# Patient Record
Sex: Female | Born: 1965 | Race: White | Hispanic: No | Marital: Married | Smoking: Never smoker
Health system: Southern US, Community
[De-identification: ages and names within clinical notes are randomized; demographics above are authoritative.]

## PROBLEM LIST (undated history)

## (undated) DIAGNOSIS — R011 Cardiac murmur, unspecified: Secondary | ICD-10-CM

## (undated) DIAGNOSIS — M069 Rheumatoid arthritis, unspecified: Secondary | ICD-10-CM

## (undated) DIAGNOSIS — I10 Essential (primary) hypertension: Secondary | ICD-10-CM

## (undated) DIAGNOSIS — F419 Anxiety disorder, unspecified: Secondary | ICD-10-CM

## (undated) HISTORY — DX: Anxiety disorder, unspecified: F41.9

## (undated) HISTORY — DX: Essential (primary) hypertension: I10

## (undated) HISTORY — DX: Rheumatoid arthritis, unspecified: M06.9

---

## 1998-04-14 HISTORY — PX: ABDOMINAL HYSTERECTOMY: SHX81

## 2001-04-14 HISTORY — PX: TONSILLECTOMY: SUR1361

## 2002-04-14 HISTORY — PX: NASAL SEPTUM SURGERY: SHX37

## 2004-04-13 ENCOUNTER — Emergency Department: Payer: Self-pay | Admitting: General Practice

## 2004-10-24 ENCOUNTER — Ambulatory Visit: Payer: Self-pay | Admitting: Family Medicine

## 2005-02-06 ENCOUNTER — Ambulatory Visit: Payer: Self-pay | Admitting: Unknown Physician Specialty

## 2005-03-04 ENCOUNTER — Ambulatory Visit: Payer: Self-pay | Admitting: Family Medicine

## 2005-05-28 ENCOUNTER — Emergency Department: Payer: Self-pay | Admitting: Emergency Medicine

## 2005-07-28 ENCOUNTER — Ambulatory Visit: Payer: Self-pay | Admitting: Unknown Physician Specialty

## 2006-05-14 ENCOUNTER — Ambulatory Visit: Payer: Self-pay | Admitting: Gastroenterology

## 2007-04-29 ENCOUNTER — Ambulatory Visit: Payer: Self-pay | Admitting: Unknown Physician Specialty

## 2009-08-27 ENCOUNTER — Ambulatory Visit: Payer: Self-pay | Admitting: Gastroenterology

## 2011-11-05 ENCOUNTER — Emergency Department: Payer: Self-pay | Admitting: Emergency Medicine

## 2011-11-13 ENCOUNTER — Ambulatory Visit: Payer: Self-pay | Admitting: Unknown Physician Specialty

## 2012-04-14 HISTORY — PX: BREAST BIOPSY: SHX20

## 2012-10-18 ENCOUNTER — Other Ambulatory Visit: Payer: Self-pay | Admitting: Bariatrics

## 2012-10-18 ENCOUNTER — Ambulatory Visit: Payer: Self-pay | Admitting: Bariatrics

## 2012-10-18 DIAGNOSIS — IMO0001 Reserved for inherently not codable concepts without codable children: Secondary | ICD-10-CM

## 2012-10-18 DIAGNOSIS — K219 Gastro-esophageal reflux disease without esophagitis: Secondary | ICD-10-CM

## 2012-10-18 LAB — CBC WITH DIFFERENTIAL/PLATELET
Basophil #: 0 x10 3/mm 3
Basophil %: 0.7 %
Eosinophil #: 0.3 x10 3/mm 3
Eosinophil %: 4.4 %
HCT: 37.7 %
HGB: 12.8 g/dL
Lymphocyte %: 20.9 %
Lymphs Abs: 1.5 x10 3/mm 3
MCH: 30.4 pg
MCHC: 34 g/dL
MCV: 89 fL
Monocyte #: 0.5 "x10 3/mm "
Monocyte %: 6.6 %
Neutrophil #: 4.8 x10 3/mm 3
Neutrophil %: 67.4 %
Platelet: 310 x10 3/mm 3
RBC: 4.22 X10 6/mm 3
RDW: 14.2 %
WBC: 7.1 x10 3/mm 3

## 2012-10-18 LAB — PHOSPHORUS: Phosphorus: 3 mg/dL

## 2012-10-18 LAB — MAGNESIUM: Magnesium: 1.9 mg/dL

## 2012-10-18 LAB — AMYLASE: Amylase: 54 U/L (ref 25–115)

## 2012-10-18 LAB — COMPREHENSIVE METABOLIC PANEL
Albumin: 3.4 g/dL (ref 3.4–5.0)
Alkaline Phosphatase: 122 U/L (ref 50–136)
Anion Gap: 7 (ref 7–16)
BUN: 16 mg/dL (ref 7–18)
Bilirubin,Total: 0.4 mg/dL (ref 0.2–1.0)
Calcium, Total: 9 mg/dL (ref 8.5–10.1)
Chloride: 101 mmol/L (ref 98–107)
Co2: 33 mmol/L — ABNORMAL HIGH (ref 21–32)
Creatinine: 1.06 mg/dL (ref 0.60–1.30)
EGFR (African American): >60
EGFR (Non-African Amer.): >60
Glucose: 122 mg/dL — ABNORMAL HIGH (ref 65–99)
Osmolality: 284 (ref 275–301)
Potassium: 2.9 mmol/L — ABNORMAL LOW (ref 3.5–5.1)
SGOT(AST): 25 U/L (ref 15–37)
SGPT (ALT): 37 U/L (ref 12–78)
Sodium: 141 mmol/L (ref 136–145)
Total Protein: 7.8 g/dL (ref 6.4–8.2)

## 2012-10-18 LAB — TSH: Thyroid Stimulating Horm: 2.54 u[IU]/mL

## 2012-10-18 LAB — IRON AND TIBC
Iron Bind.Cap.(Total): 307 ug/dL (ref 250–450)
Iron Saturation: 16 %
Iron: 49 ug/dL — ABNORMAL LOW (ref 50–170)
Unbound Iron-Bind.Cap.: 258 ug/dL

## 2012-10-18 LAB — PROTIME-INR
INR: 1
Prothrombin Time: 13.4 s

## 2012-10-18 LAB — FOLATE: Folic Acid: 10.4 ng/mL

## 2012-10-18 LAB — BILIRUBIN, DIRECT: Bilirubin, Direct: 0.1 mg/dL (ref 0.00–0.20)

## 2012-10-18 LAB — APTT: Activated PTT: 33.9 secs (ref 23.6–35.9)

## 2012-10-18 LAB — FERRITIN: Ferritin (ARMC): 101 ng/mL

## 2012-10-18 LAB — LIPASE, BLOOD: Lipase: 325 U/L (ref 73–393)

## 2012-10-18 LAB — HEMOGLOBIN A1C: Hemoglobin A1C: 6.3 %

## 2012-10-21 ENCOUNTER — Ambulatory Visit
Admission: RE | Admit: 2012-10-21 | Discharge: 2012-10-21 | Disposition: A | Discharge status: Home / Self Care | Payer: Managed Care, Other (non HMO) | Source: Ambulatory Visit | Attending: Bariatrics | Admitting: Bariatrics

## 2012-10-21 DIAGNOSIS — K219 Gastro-esophageal reflux disease without esophagitis: Secondary | ICD-10-CM

## 2012-10-21 DIAGNOSIS — IMO0001 Reserved for inherently not codable concepts without codable children: Secondary | ICD-10-CM

## 2012-11-01 ENCOUNTER — Ambulatory Visit: Payer: Self-pay | Admitting: Internal Medicine

## 2012-11-02 ENCOUNTER — Ambulatory Visit: Payer: Self-pay | Admitting: Internal Medicine

## 2012-11-10 ENCOUNTER — Encounter: Payer: Self-pay | Admitting: General Surgery

## 2012-11-10 ENCOUNTER — Ambulatory Visit (INDEPENDENT_AMBULATORY_CARE_PROVIDER_SITE_OTHER): Payer: Managed Care, Other (non HMO) | Admitting: General Surgery

## 2012-11-10 VITALS — BP 118/80 | HR 70 | Resp 12 | Ht 66.0 in | Wt 310.0 lb

## 2012-11-10 DIAGNOSIS — R928 Other abnormal and inconclusive findings on diagnostic imaging of breast: Secondary | ICD-10-CM | POA: Insufficient documentation

## 2012-11-10 NOTE — Patient Instructions (Addendum)
Continue self breast exams. Call office for any new breast issues or concerns.  The breast biopsy procedure was reviewed with the patient. The potential for bleeding, infection, and pain was reviewed. At this time, the benefits outweigh the risk and the patient is amenable to proceed.  Breast Biopsy, Stereotactic A stereotactic breast biopsy takes a tissue sample from the breast with a special instrument. This is done when:  The problem (lump, abnormality, mass) can be seen on X-ray, but not felt on physical exam.  Suspicious, small calcium deposits (calcifications) are seen in the breast.  There is a change in shape or appearance of the breast, thickening, or asymmetry on mammogram (breast X-ray).  You have nipple changes (unusual or bloody discharge, crusting, retraction, dimpling).  Your caregiver is making a surgical diagnosis. The biopsy may be done on a special table, with your face down and your breasts placed through openings in the table. Computerized imaging (special form of X-rays) is used. The images are not obtained using regular X-ray film. So, exposure to radiation is reduced. Images are seen through several different angles. The surgeon removes small pieces of the suspicious tissue through a hollow needle. The tissue will be sent to the lab for analysis. The surgeon can look at the pictures right away, rather than wait for an X-ray to be developed. Your caregiver can mark the lesions (abnormal tissue formations) electronically. Then the computer can tell exactly where the problem is, or if it has moved. BENEFITS OF THE PROCEDURE  This is a good way to see if tiny lumps, abnormal looking tissue, or calcium deposits that you cannot feel are cancerous or require further treatment or follow-up.  Needle biopsy is a simple procedure. It may be performed in an outpatient imaging center. This means you have the procedure and go home the same day, without checking into a hospital.  It  is less painful than open surgery. The results are as accurate as when a tissue sample is removed surgically.  The procedure is faster, less expensive, less invasive, does not distort the breast, and leaves little or no scar.  Breast defects, which can make future mammograms hard to read and interpret, do not remain.  Recovery time is brief. Patients can soon resume their normal activities.  Using VAD (vacuum assisted device) may make it possible to remove entire lesions.  A breast biopsy can indicate if you need surgery, other treatment, or combined treatment. LET YOUR CAREGIVER KNOW ABOUT:  Allergies.  Medications taken, including herbs, eye drops, over-the-counter medications, and creams.  Use of steroids (by mouth or creams).  Previous problems with anesthetics or numbing medication.  If you are taking blood thinner medications or aspirin.  Possibility of pregnancy, if this applies.  History of blood clots (thrombophlebitis).  History of bleeding or blood problems.  Previous surgery.  Other health problems. RISKS AND COMPLICATIONS  Infection (germ growing in the wound). This can often be treated with antibiotics.  Bleeding, following surgery. Your surgeon takes every precaution to keep this from happening.  There is some concern that if a cancerous mass is present, cancer cells might be spread by the needle. Whether this actually happens is not known. It does not appear to be a significant risk.  X-ray guided breast biopsy is not infallible (not always correct). The problem may be missed or the extent of the problem may be underestimated. This would mean the biopsy did not manage to remove a piece of the diseased tissue or  enough of the diseased tissue.  Lesions present, with calcium deposits scattered throughout the breast, are difficult to target by stereotactic method. Those lesions near the chest wall also are hard to learn about by this method. If the mammogram  shows only a vague change in tissue density, but no definite mass or nodule, the X-ray guided method may not be successful. Occasionally, even after a successful biopsy, the tissue diagnosis remains uncertain. A surgical biopsy will be needed, if abnormal or precancerous cells are found on core biopsy.  Altering or deforming of the breast.  Unable to find, or missing the lesion.  Rarely, the needle may go through the chest wall into the lung area. TWO BIOPSY INSTRUMENTS MAY BE USED IN THE PROCEDURE The conventional biopsy device (core needle biopsy device) consists of an inner needle with a trough extending from it at one end, and an overlying sheath. It is attached to a spring-loaded mechanism that propels it forward. The trough fills with tissue. The outer sheath instantly moves forward to cut the tissue and keep it in the trough. Each sample is obtained in a fraction of a second. It is necessary to withdraw the needle after each sample is taken to collect the tissue.  A newer type of instrument, the VAD (vacuum assisted device), uses vacuum pressure to pull breast tissue into a needle and remove it. The needle does not need to be withdrawn after each sampling. Another advantage is that biopsies are obtained in an orderly manner, by rotating the device. This helps make sure that the entire area of interest will be sampled. When using the automated core biopsy needle, sampling is more random.  FOR COMFORT DURING THE TEST  Relax as much as possible.  Try to follow instructions, to speed up the test.  Let your caregiver know if you are uncomfortable, anxious, or in pain. PROCEDURE  You are awake during the procedure, and you go home the same day (outpatient). A specially trained radiologist will do this procedure. First, the skin is cleansed. Then, it is injected with a local anesthetic. A small nick is made in the skin, and the tip of the biopsy needle is put into the calculated site of the lesion.  A special mammography machine uses ionizing radiation to help guide the radiologist's instrument to the site of the abnormal growth. At this point, stereo images are again obtained, to confirm that the needle tip is at the problem area. Usually 5 to 10 samples are collected when doing a core biopsy. At least 12 are collected when using the vacuum assisted device (VAD). Then, a final set of images is obtained. If they show that the lesion has been mostly or completely removed, a small clip is left at the biopsy site. This is so that it can be easily located, in case the lesion turns out to be cancer. Afterward, the skin opening is stitched (sutured) or taped closed, and covered with a dressing. Your caregiver may apply a pressure dressing and an ice pack, to prevent bleeding and swelling in the breast.  X-ray guided breast biopsy can take 30 minutes to 1 hour, or more. The X-rays usually have no side effects, and no radiation remains in your body. There is usually little or no pain. Usually no scar is left from the tiny skin incision. Many women find that the major discomfort of the procedure is from lying on their stomach, or staying in 1 position for the length of the procedure. This  discomfort may be reduced by carefully placed cushions. You should wear a good support bra to the procedure. You will be asked to remove jewelry, dentures, eye glasses, metal objects, or clothing that might interfere with the X-ray images. You may want to have someone with you, to take you home after the procedure. AFTER THE PROCEDURE   After surgery, if you are doing well and have no problems, you will be allowed to go home.  You may resume your regular diet, or as directed by your caregiver. HOME CARE INSTRUCTIONS   Follow your caregiver's recommendations for medications, care of the biopsy site, follow-up appointments, and further treatment.  Only take over-the-counter or prescription medicines for pain, discomfort, or  fever as directed by your caregiver.  An ice pack applied to the affected area may help with discomfort and keep the swelling down.  Change dressings as directed.  Wear a good support bra for as long as your caregiver recommends.  Avoid strenuous activity for at least 24 hours, or as advised by your caregiver. Finding out the results of your test Not all test results are available during your visit. If your test results are not back during the visit, make an appointment with your caregiver to find out the results. Do not assume everything is normal if you have not heard from your caregiver or the medical facility. It is important for you to follow up on all of your test results.  SEEK MEDICAL CARE IF:   You develop a rash.  You have problems with your medicines.  You become lightheaded or dizzy. SEEK IMMEDIATE MEDICAL CARE IF:   There is increased bleeding (more than a small spot) from the biopsy site.  You notice redness, swelling, or increasing pain in the wound.  Pus is coming from the wound.  You have a fever.  You notice a bad smell coming from the wound or dressing.  You develop shortness of breath.  You develop chest pain.  You pass out. Document Released: 12/28/2002 Document Revised: 06/23/2011 Document Reviewed: 02/02/2009 [facility] Patient Information 2014 [place], [place].  Patient has been scheduled for a left breast stereotactic biopsy at [facility] for 11-29-12 at 4 pm. She will check-in at the [facility] at 3:30 pm. This patient is aware of date, time, and instructions. Patient verbalizes understanding.

## 2012-11-10 NOTE — Progress Notes (Signed)
Patient ID: Danielle Vance, female   DOB: September 17, 1965, 47 y.o.   MRN: [id number]  Chief Complaint  Patient presents with  . Follow-up    mammogram    HPI Danielle Vance is a 47 y.o. female who presents for a breast evaluation. The most recent mammogram was done on 11-02-12 and ultrasound as well. States she "can't feel anything".  Denies any recent breast trauma or injury. This was the patient's first mammogram in 5 years. Patient does perform regular self breast checks and her previous mammogram was 2009. No family history of breast cancer. She is currently discussing gastric bypass with Dr Alva Garnet for later in the year.  HPI  Past Medical History  Diagnosis Date  . Rheumatoid arthritis   . Hypertension   . Anxiety     Past Surgical History  Procedure Laterality Date  . Nasal septum surgery  2004  . Tonsillectomy  2003  . Abdominal hysterectomy  2000    No family history on file.  Social History History  Substance Use Topics  . Smoking status: Never Smoker   . Smokeless tobacco: Never Used  . Alcohol Use: No    No Known Allergies  Current Outpatient Prescriptions  Medication Sig Dispense Refill  . atenolol-chlorthalidone (TENORETIC) 50-25 MG per tablet Take 1 tablet by mouth daily.      . citalopram (CELEXA) 20 MG tablet Take 1 tablet by mouth daily.      . folic acid (FOLVITE) 1 MG tablet Take 1 tablet by mouth daily.      . hydroxychloroquine (PLAQUENIL) 200 MG tablet Take 1 tablet by mouth 2 (two) times daily.      Marland Kitchen KLOR-CON 10 10 MEQ tablet Take 1 tablet by mouth daily.      . methotrexate (RHEUMATREX) 2.5 MG tablet Take 1 tablet by mouth daily.       No current facility-administered medications for this visit.    Review of Systems Review of Systems  Constitutional: Negative.   Respiratory: Negative.   Cardiovascular: Negative.     Blood pressure 118/80, pulse 70, resp. rate 12, height 5\' 6"  (1.676 m), weight 310 lb (140.615 kg).  Physical Exam Physical  Exam  Constitutional: She is oriented to person, place, and time. She appears well-developed and well-nourished.  Cardiovascular: Normal rate and regular rhythm.   Pulmonary/Chest: Effort normal and breath sounds normal. Right breast exhibits no inverted nipple, no mass, no nipple discharge, no skin change and no tenderness. Left breast exhibits no inverted nipple, no mass, no nipple discharge, no skin change and no tenderness.  Lymphadenopathy:    She has no cervical adenopathy.    She has no axillary adenopathy.  Neurological: She is alert and oriented to person, place, and time.  Skin: Skin is warm and dry.    Data Reviewed Screening mammograms dated November 01, 2012 suggested an ill-defined density in the inferior medial aspect of the left breast. Stable medial retroareolar nodularity noted. BI-RAD-0.  Left breast ultrasound dated November 02, 2012 showed no ultrasound abnormality in the area of mammographic concern. A benign intramammary lymph node was noted in the upper inner quadrant of the breast. BI-RAD-4. Focal spot compression views of the same date showed persistent density in the lower outer quadrant of the breast.  Review of the 2009 films suggest a vague density in the area present concern, not as prominent as noted earlier this month.  Focused ultrasound examination of the left breast from the 6 to 10:00 position was completed.  No clear-cut area of architectural abnormality, cystic or solid lesion identified. No charge. No images.  Assessment    Possible abnormality of the lower inner quadrant left breast.     Plan    Options for management were reviewed: 1) early stereotactic biopsy versus 2) 6 months observation. The patient reports she is planning on gastric bypass in October of this year, and I would anticipate a 30-40 pound weight loss between now and the neck mammogram. This could certainly change the architecture of the breast and make comparison very difficult. For this  reason I recommended early biopsy. The stereotactic procedure was reviewed in detail.     Patient has been scheduled for a left breast stereotactic biopsy at [facility] for 11-29-12 at 4 pm. She will check-in at the [facility] at 3:30 pm. This patient is aware of date, time, and instructions. Patient verbalizes understanding.   Earline Mayotte 11/10/2012, 8:50 PM

## 2012-11-15 ENCOUNTER — Ambulatory Visit: Payer: Self-pay | Admitting: Bariatrics

## 2012-11-29 ENCOUNTER — Ambulatory Visit: Payer: Self-pay | Admitting: General Surgery

## 2012-11-29 DIAGNOSIS — N63 Unspecified lump in unspecified breast: Secondary | ICD-10-CM

## 2012-12-02 ENCOUNTER — Encounter: Payer: Self-pay | Admitting: General Surgery

## 2012-12-02 ENCOUNTER — Telehealth: Payer: Self-pay | Admitting: *Deleted

## 2012-12-02 LAB — PATHOLOGY REPORT

## 2012-12-02 NOTE — Telephone Encounter (Signed)
Message     Please notify the patient the pathology was fine. Arrange for a follow up unilateral mammogram in six months w/ OV to follow. Thanks.   Patient notified as instructed and verbalizes understanding. She has been placed in the recalls for 6 months.

## 2012-12-06 ENCOUNTER — Ambulatory Visit (INDEPENDENT_AMBULATORY_CARE_PROVIDER_SITE_OTHER): Payer: Managed Care, Other (non HMO) | Admitting: *Deleted

## 2012-12-06 DIAGNOSIS — R928 Other abnormal and inconclusive findings on diagnostic imaging of breast: Secondary | ICD-10-CM

## 2012-12-06 NOTE — Patient Instructions (Signed)
Patient here today for follow up post left breast biopsy.  Dressing removed, steristrip in place and aware it may come off in one week.  Minimal bruising noted.  The patient is aware that a heating pad may be used for comfort as needed.  Aware of pathology. Follow up as scheduled. 

## 2012-12-13 ENCOUNTER — Ambulatory Visit: Payer: Self-pay | Admitting: Bariatrics

## 2013-01-14 ENCOUNTER — Ambulatory Visit: Payer: Self-pay | Admitting: Bariatrics

## 2013-01-14 LAB — POTASSIUM: Potassium: 3.3 mmol/L — ABNORMAL LOW (ref 3.5–5.1)

## 2013-01-14 LAB — MAGNESIUM: Magnesium: 1.9 mg/dL

## 2013-02-17 ENCOUNTER — Other Ambulatory Visit: Payer: Self-pay

## 2013-03-03 ENCOUNTER — Other Ambulatory Visit: Payer: Self-pay | Admitting: Bariatrics

## 2013-03-03 LAB — POTASSIUM: Potassium: 3.7 mmol/L (ref 3.5–5.1)

## 2013-03-14 HISTORY — PX: ROUX-EN-Y GASTRIC BYPASS: SHX1104

## 2013-03-21 DIAGNOSIS — I10 Essential (primary) hypertension: Secondary | ICD-10-CM | POA: Insufficient documentation

## 2013-03-21 DIAGNOSIS — G4733 Obstructive sleep apnea (adult) (pediatric): Secondary | ICD-10-CM | POA: Insufficient documentation

## 2013-04-04 ENCOUNTER — Ambulatory Visit: Payer: Self-pay | Admitting: Bariatrics

## 2013-04-14 ENCOUNTER — Ambulatory Visit: Payer: Self-pay | Admitting: Bariatrics

## 2013-06-02 ENCOUNTER — Ambulatory Visit: Payer: Self-pay | Admitting: General Surgery

## 2013-06-02 ENCOUNTER — Encounter: Payer: Self-pay | Admitting: General Surgery

## 2013-06-13 ENCOUNTER — Encounter: Payer: Self-pay | Admitting: General Surgery

## 2013-06-13 ENCOUNTER — Ambulatory Visit (INDEPENDENT_AMBULATORY_CARE_PROVIDER_SITE_OTHER): Payer: Managed Care, Other (non HMO) | Admitting: General Surgery

## 2013-06-13 ENCOUNTER — Ambulatory Visit: Payer: Managed Care, Other (non HMO) | Admitting: General Surgery

## 2013-06-13 VITALS — BP 140/84 | HR 76 | Resp 12 | Ht 66.0 in | Wt 257.0 lb

## 2013-06-13 DIAGNOSIS — N63 Unspecified lump in unspecified breast: Secondary | ICD-10-CM

## 2013-06-13 DIAGNOSIS — R928 Other abnormal and inconclusive findings on diagnostic imaging of breast: Secondary | ICD-10-CM

## 2013-06-13 NOTE — Progress Notes (Signed)
Patient ID: Danielle Vance, female   DOB: 01/16/1966, 48 y.o.   MRN: [id number]  Chief Complaint  Patient presents with  . Follow-up    6 month left diagnostic mammogram    HPI Danielle Vance is a 48 y.o. female who presents for a breast evaluation. The most recent mammogram was done on 06/02/13. Patient does perform regular self breast checks and gets regular mammograms done. The patient denies any new problems with the breasts at this time.    HPI  Past Medical History  Diagnosis Date  . Rheumatoid arthritis   . Hypertension   . Anxiety     Past Surgical History  Procedure Laterality Date  . Nasal septum surgery  2004  . Tonsillectomy  2003  . Abdominal hysterectomy  2000  . 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 No pertinent family history.  Social History History  Substance Use Topics  . Smoking status: Never Smoker   . Smokeless tobacco: Never Used  . Alcohol Use: No    No Known Allergies  Current Outpatient Prescriptions  Medication Sig Dispense Refill  . citalopram (CELEXA) 20 MG tablet Take 1 tablet by mouth daily.       No current facility-administered medications for this visit.    Review of Systems Review of Systems  Constitutional: Negative.   Respiratory: Negative.   Cardiovascular: Negative.     Blood pressure 140/84, pulse 76, resp. rate 12, height 5\' 6"  (1.676 m), weight 257 lb (116.574 kg).  Physical Exam Physical Exam  Constitutional: She is oriented to person, place, and time. She appears well-developed and well-nourished.  Neck: Neck supple. No thyromegaly present.  Cardiovascular: Normal rate, regular rhythm and normal heart sounds.   No murmur heard. Pulmonary/Chest: Effort normal and breath sounds normal. Right breast exhibits no inverted nipple, no mass, no nipple discharge, no skin change and no tenderness. Left breast exhibits no inverted nipple, no mass, no nipple discharge, no skin change and no tenderness.   Lymphadenopathy:    She has no cervical adenopathy.    She has no axillary adenopathy.  Neurological: She is alert and oriented to person, place, and time.  Skin: Skin is warm and dry.    Data Reviewed Biopsy of the left breast completed 11/30/2012 showed benign breast tissue with proliferative fibrocystic changes. Single foci of calcification. No atypia or malignancy. Focal cyst formation was also identified.  Left breast mammogram dated 06/02/2013 and was reviewed with the pre-biopsy mammograms. The area of asymmetric density is nearly completely resolved by my review. The radiologist as less convinced that recommended a followup bilateral diagnostic mammogram in 5 months.  Assessment    Benign breast exam. Negative biopsy. Near complete clearing of the density.    Plan    I would not have the patient get diagnostic mammograms, but to work with the radiology service these will be scheduled in 6 months. The patient does not need a followup office visit unless there is interval change which is unanticipated at this time. She will resume annual screening mammograms with her primary care provider in fall 2016.       2017 06/14/2013, 12:42 PM

## 2013-06-13 NOTE — Patient Instructions (Signed)
Patient to return as needed. Patient will have bilateral screening mammogram in 6 months. Patient to call with questions or concerns.

## 2013-06-20 ENCOUNTER — Other Ambulatory Visit: Payer: Self-pay | Admitting: General Practice

## 2013-06-20 LAB — COMPREHENSIVE METABOLIC PANEL
Albumin: 3.5 g/dL (ref 3.4–5.0)
Alkaline Phosphatase: 106 U/L
Anion Gap: 6 — ABNORMAL LOW (ref 7–16)
BUN: 11 mg/dL (ref 7–18)
Bilirubin,Total: 0.6 mg/dL (ref 0.2–1.0)
Calcium, Total: 9.6 mg/dL (ref 8.5–10.1)
Chloride: 104 mmol/L (ref 98–107)
Co2: 29 mmol/L (ref 21–32)
Creatinine: 0.72 mg/dL (ref 0.60–1.30)
EGFR (African American): >60
EGFR (Non-African Amer.): >60
Glucose: 85 mg/dL (ref 65–99)
Osmolality: 276 (ref 275–301)
Potassium: 3.4 mmol/L — ABNORMAL LOW (ref 3.5–5.1)
SGOT(AST): 25 U/L (ref 15–37)
SGPT (ALT): 36 U/L (ref 12–78)
Sodium: 139 mmol/L (ref 136–145)
Total Protein: 8.2 g/dL (ref 6.4–8.2)

## 2013-06-20 LAB — IRON AND TIBC
Iron Bind.Cap.(Total): 287 ug/dL (ref 250–450)
Iron Saturation: 16 %
Iron: 46 ug/dL — ABNORMAL LOW (ref 50–170)
Unbound Iron-Bind.Cap.: 241 ug/dL

## 2013-06-20 LAB — CBC WITH DIFFERENTIAL/PLATELET
Basophil #: 0 10*3/uL (ref 0.0–0.1)
Basophil %: 0.7 %
Eosinophil #: 0.2 10*3/uL (ref 0.0–0.7)
Eosinophil %: 3.6 %
HCT: 44.1 % (ref 35.0–47.0)
HGB: 14.3 g/dL (ref 12.0–16.0)
Lymphocyte #: 1.4 10*3/uL (ref 1.0–3.6)
Lymphocyte %: 20.1 %
MCH: 28.5 pg (ref 26.0–34.0)
MCHC: 32.4 g/dL (ref 32.0–36.0)
MCV: 88 fL (ref 80–100)
Monocyte #: 0.3 x10 3/mm (ref 0.2–0.9)
Monocyte %: 4.9 %
Neutrophil #: 4.9 10*3/uL (ref 1.4–6.5)
Neutrophil %: 70.7 %
Platelet: 262 10*3/uL (ref 150–440)
RBC: 5.02 10*6/uL (ref 3.80–5.20)
RDW: 14.5 % (ref 11.5–14.5)
WBC: 6.9 10*3/uL (ref 3.6–11.0)

## 2013-06-20 LAB — MAGNESIUM: Magnesium: 1.8 mg/dL

## 2013-06-20 LAB — FOLATE: Folic Acid: 27.6 ng/mL (ref 3.1–100.0)

## 2013-06-20 LAB — PHOSPHORUS: Phosphorus: 2.8 mg/dL (ref 2.5–4.9)

## 2013-06-20 LAB — FERRITIN: Ferritin (ARMC): 71 ng/mL (ref 8–388)

## 2013-06-20 LAB — AMYLASE: Amylase: 43 U/L (ref 25–115)

## 2013-08-12 DIAGNOSIS — I7300 Raynaud's syndrome without gangrene: Secondary | ICD-10-CM | POA: Insufficient documentation

## 2013-08-12 DIAGNOSIS — M069 Rheumatoid arthritis, unspecified: Secondary | ICD-10-CM | POA: Insufficient documentation

## 2013-08-31 DIAGNOSIS — M179 Osteoarthritis of knee, unspecified: Secondary | ICD-10-CM | POA: Insufficient documentation

## 2013-08-31 DIAGNOSIS — M1710 Unilateral primary osteoarthritis, unspecified knee: Secondary | ICD-10-CM | POA: Insufficient documentation

## 2013-12-21 ENCOUNTER — Ambulatory Visit: Payer: Self-pay | Admitting: Bariatrics

## 2013-12-21 LAB — COMPREHENSIVE METABOLIC PANEL
Albumin: 3.6 g/dL (ref 3.4–5.0)
Alkaline Phosphatase: 97 U/L
Anion Gap: 5 — ABNORMAL LOW (ref 7–16)
BUN: 11 mg/dL (ref 7–18)
Bilirubin,Total: 0.5 mg/dL (ref 0.2–1.0)
Calcium, Total: 9.5 mg/dL (ref 8.5–10.1)
Chloride: 106 mmol/L (ref 98–107)
Co2: 31 mmol/L (ref 21–32)
Creatinine: 0.67 mg/dL (ref 0.60–1.30)
EGFR (African American): >60
EGFR (Non-African Amer.): >60
Glucose: 90 mg/dL (ref 65–99)
Osmolality: 282 (ref 275–301)
Potassium: 3.6 mmol/L (ref 3.5–5.1)
SGOT(AST): 19 U/L (ref 15–37)
SGPT (ALT): 22 U/L
Sodium: 142 mmol/L (ref 136–145)
Total Protein: 7.8 g/dL (ref 6.4–8.2)

## 2013-12-21 LAB — CBC WITH DIFFERENTIAL/PLATELET
Basophil #: 0 10*3/uL (ref 0.0–0.1)
Basophil %: 0.7 %
Eosinophil #: 0.1 10*3/uL (ref 0.0–0.7)
Eosinophil %: 2.5 %
HCT: 44.5 % (ref 35.0–47.0)
HGB: 14.2 g/dL (ref 12.0–16.0)
Lymphocyte #: 1.3 10*3/uL (ref 1.0–3.6)
Lymphocyte %: 24.2 %
MCH: 29.7 pg (ref 26.0–34.0)
MCHC: 32 g/dL (ref 32.0–36.0)
MCV: 93 fL (ref 80–100)
Monocyte #: 0.3 x10 3/mm (ref 0.2–0.9)
Monocyte %: 5.8 %
Neutrophil #: 3.7 10*3/uL (ref 1.4–6.5)
Neutrophil %: 66.8 %
Platelet: 257 10*3/uL (ref 150–440)
RBC: 4.8 10*6/uL (ref 3.80–5.20)
RDW: 13.1 % (ref 11.5–14.5)
WBC: 5.5 10*3/uL (ref 3.6–11.0)

## 2013-12-21 LAB — MAGNESIUM: Magnesium: 1.7 mg/dL — ABNORMAL LOW

## 2013-12-21 LAB — IRON: Iron: 54 ug/dL (ref 50–170)

## 2013-12-21 LAB — FOLATE: Folic Acid: 26.8 ng/mL (ref 3.1–100.0)

## 2013-12-21 LAB — AMYLASE: Amylase: 42 U/L (ref 25–115)

## 2013-12-21 LAB — PHOSPHORUS: Phosphorus: 3.1 mg/dL (ref 2.5–4.9)

## 2013-12-21 LAB — FERRITIN: Ferritin (ARMC): 113 ng/mL (ref 8–388)

## 2014-01-18 ENCOUNTER — Ambulatory Visit: Payer: Self-pay | Admitting: Nurse Practitioner

## 2014-02-13 ENCOUNTER — Encounter: Payer: Self-pay | Admitting: General Surgery

## 2014-03-31 ENCOUNTER — Ambulatory Visit: Payer: Self-pay | Admitting: Unknown Physician Specialty

## 2014-04-10 DIAGNOSIS — M94262 Chondromalacia, left knee: Secondary | ICD-10-CM | POA: Insufficient documentation

## 2014-04-10 DIAGNOSIS — S83207D Unspecified tear of unspecified meniscus, current injury, left knee, subsequent encounter: Secondary | ICD-10-CM | POA: Insufficient documentation

## 2014-05-12 IMAGING — RF DG UGI W/ HIGH DENSITY W/KUB
19 of 24 series · 19 of 24 positions shown · non-contrast
Comparison: None.

CLINICAL DATA: Preop for gastric bypass surgery, symptoms of
gastroesophageal reflux

UPPER GI SERIES W/HIGH DENSITY W/KUB
TECHNIQUE: After obtaining a scout radiograph, upper GI series
performed with high density barium and effervescent agent. Thin
barium also used.
Fluoroscopy Time: 2 minutes 24 seconds

[Series 1: run · 1 of 1 slices shown (1 of 19)]
[im 1/1]
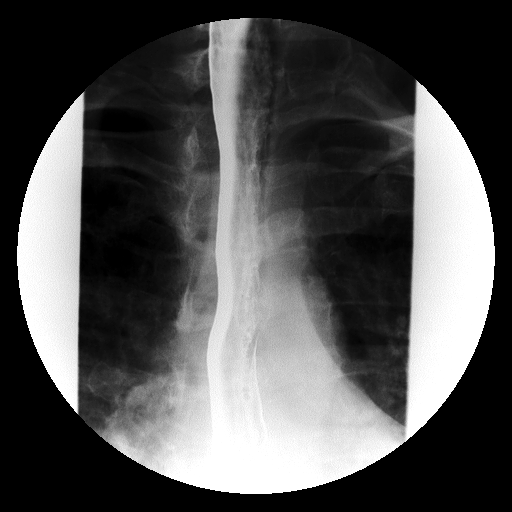

[Series 2: run · 1 of 1 slices shown (2 of 19)]
[im 1/1]
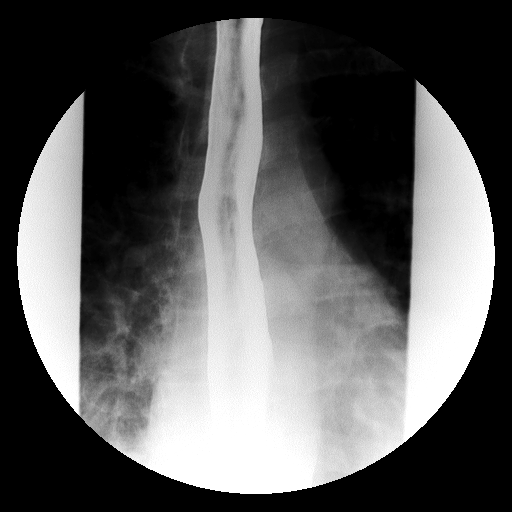

[Series 4: run · 1 of 1 slices shown (3 of 19)]
[im 1/1]
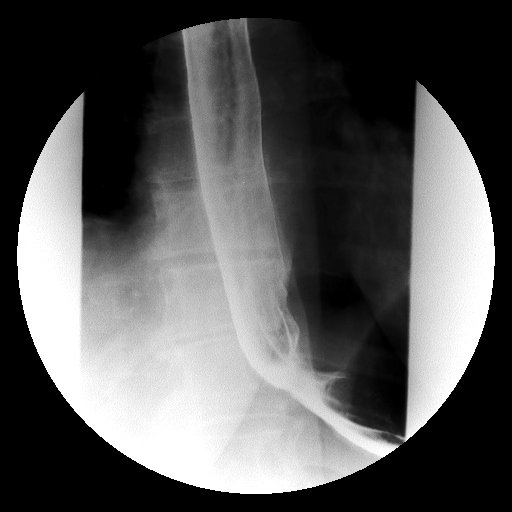

[Series 5: run · 1 of 1 slices shown (4 of 19)]
[im 1/1]
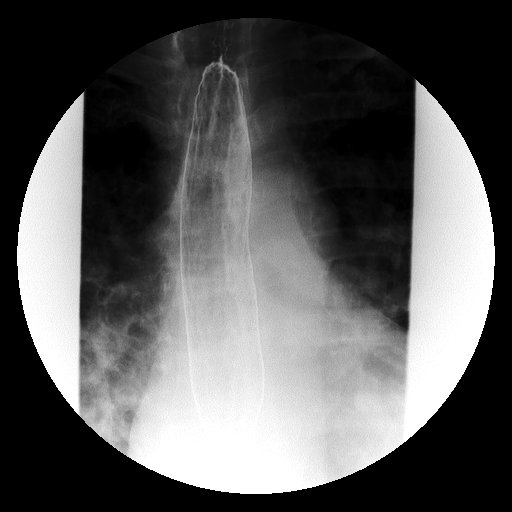

[Series 6: run · 1 of 1 slices shown (5 of 19)]
[im 1/1]
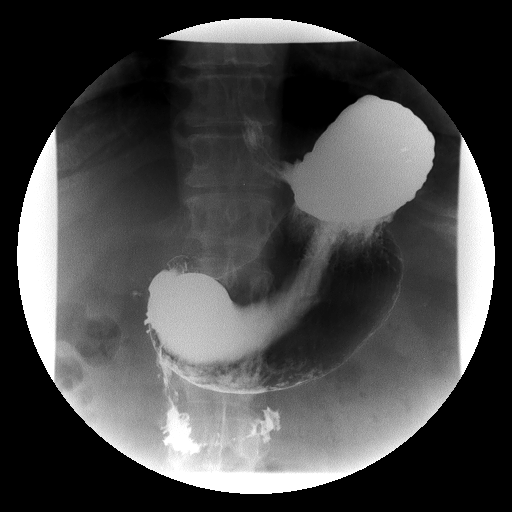

[Series 7: run · 1 of 1 slices shown (6 of 19)]
[im 1/1]
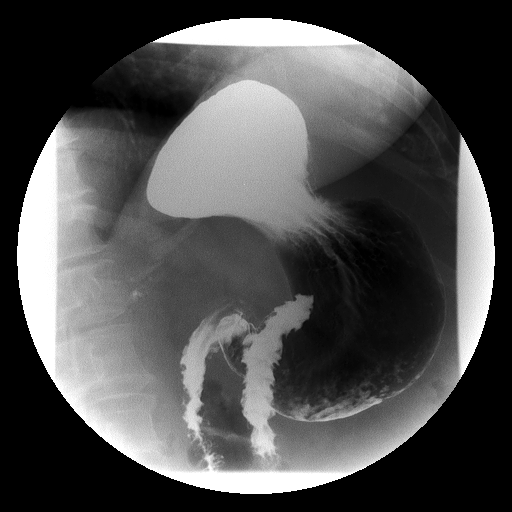

[Series 9: run · 1 of 1 slices shown (7 of 19)]
[im 1/1]
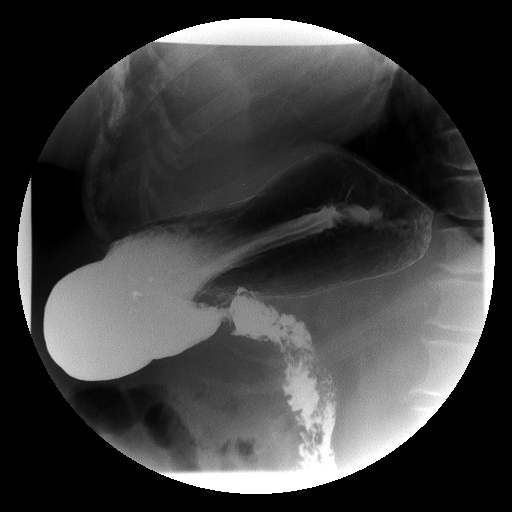

[Series 10: run · 1 of 1 slices shown (8 of 19)]
[im 1/1]
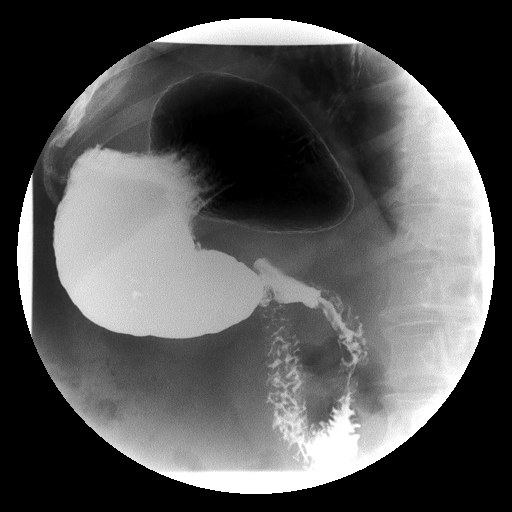

[Series 12: run · 1 of 1 slices shown (9 of 19)]
[im 1/1]
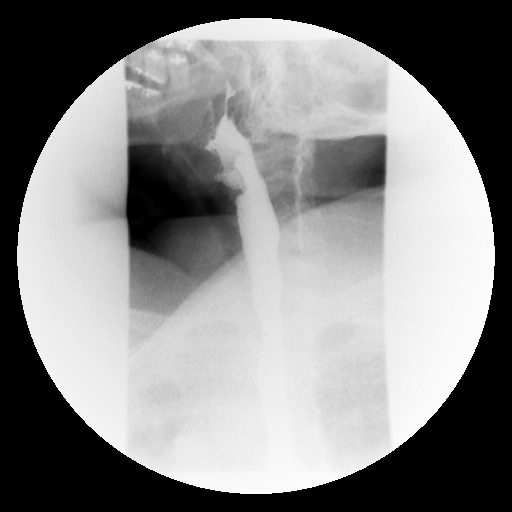

[Series 14: run · 1 of 1 slices shown (10 of 19)]
[im 1/1]
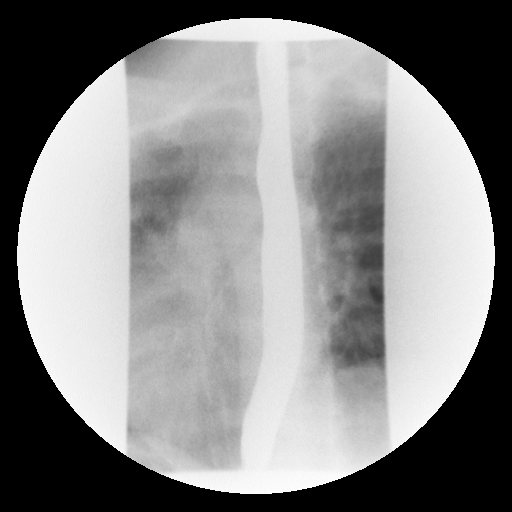

[Series 15: run · 1 of 1 slices shown (11 of 19)]
[im 1/1]
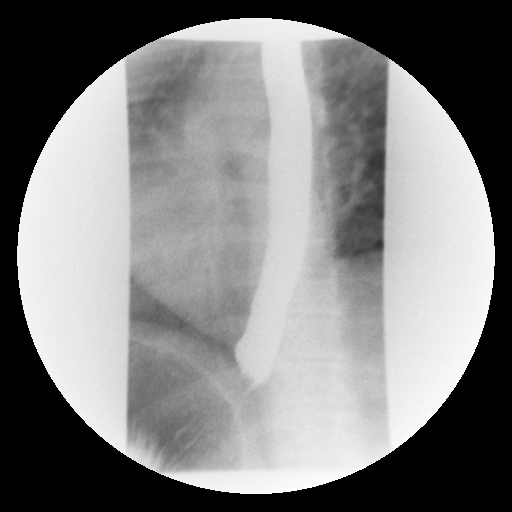

[Series 16: run · 1 of 1 slices shown (12 of 19)]
[im 1/1]
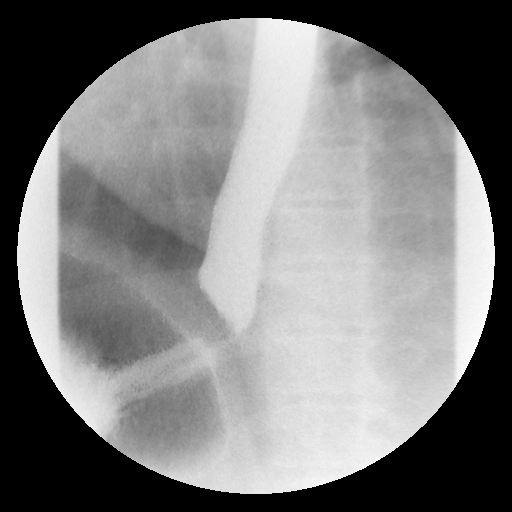

[Series 17: run · 1 of 1 slices shown (13 of 19)]
[im 1/1]
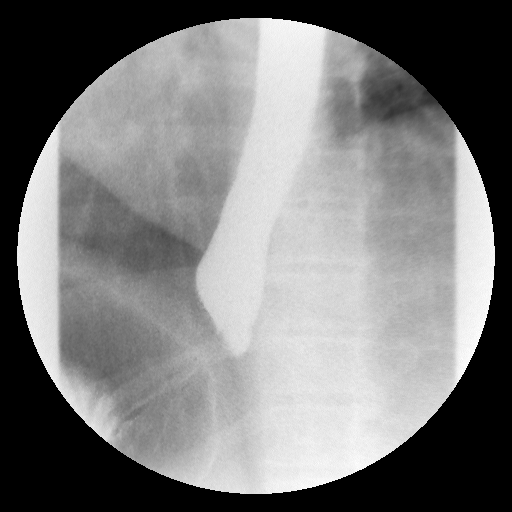

[Series 19: run · 1 of 1 slices shown (14 of 19)]
[im 1/1]
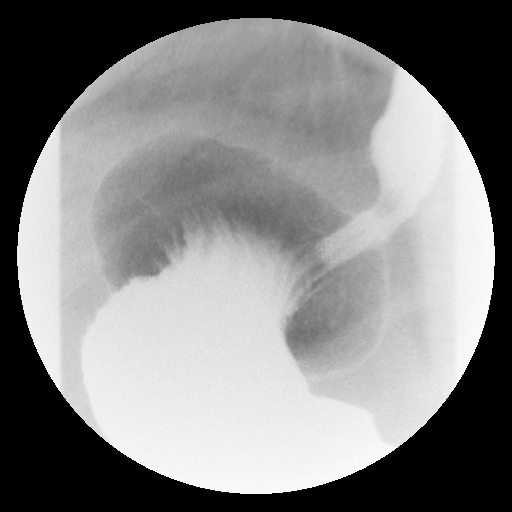

[Series 20: run · 1 of 1 slices shown (15 of 19)]
[im 1/1]
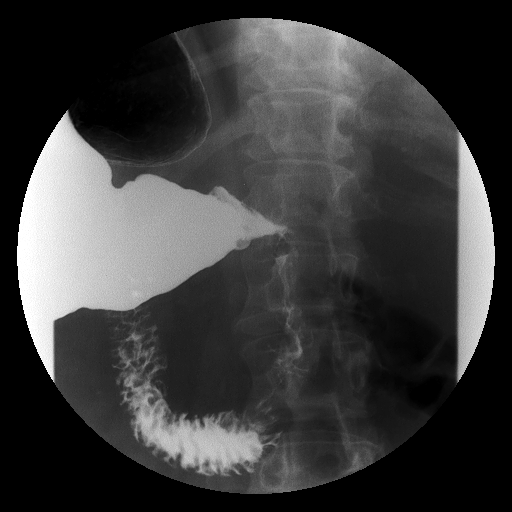

[Series 21: run · 1 of 1 slices shown (16 of 19)]
[im 1/1]
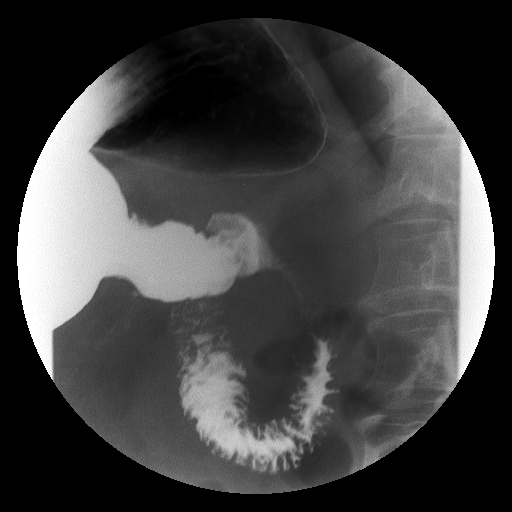

[Series 22: run · 1 of 1 slices shown (17 of 19)]
[im 1/1]
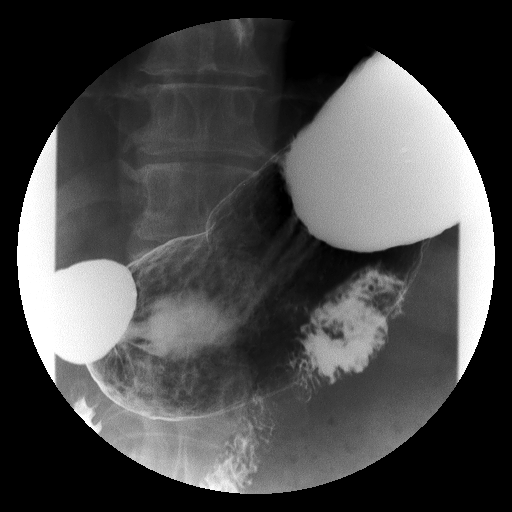

[Series 24: run · 1 of 1 slices shown (18 of 19)]
[im 1/1]
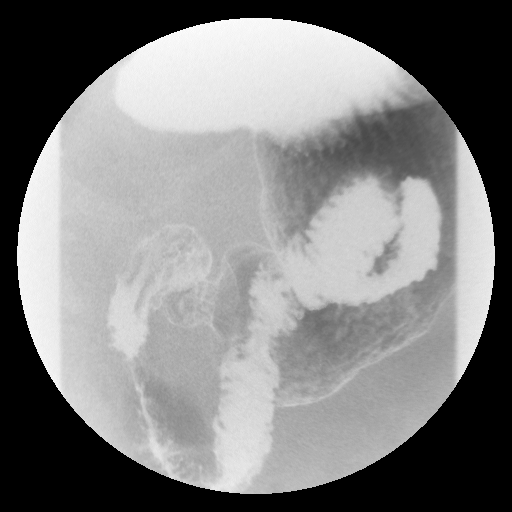

[Series 25: run · 1 of 1 slices shown (19 of 19)]
[im 1/1]
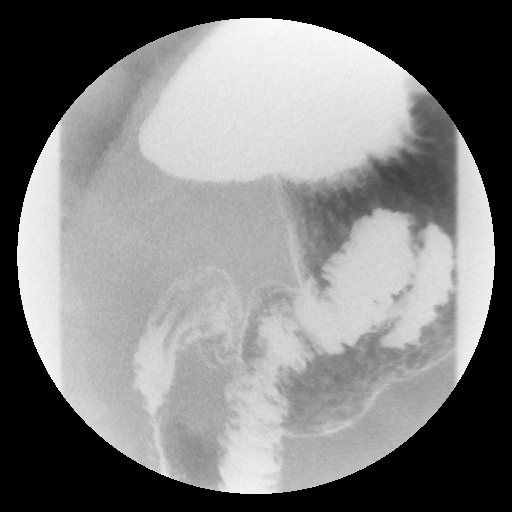

[19 of 24 positions shown; findings below may reference images not displayed]

FINDINGS: A preliminary film of the abdomen shows a nonspecific
bowel gas pattern.  No opaque calculi are noted.  The bones appear
normal.

A single contrast upper GI was performed.  The mucosa of the
esophagus is unremarkable.  A single contrast study shows the
swallowing mechanism to be normal.  Esophageal peristalsis is
normal.  No hiatal hernia is seen, and only faint gastroesophageal
reflux is demonstrated.

The stomach is normal in contour and peristalsis.  The duodenal
bulb fills and the duodenal loop is in normal position.
IMPRESSION: Negative single contrast upper GI other than very faint
gastroesophageal reflux.

## 2015-11-30 ENCOUNTER — Other Ambulatory Visit: Payer: Self-pay | Admitting: Nurse Practitioner

## 2015-11-30 DIAGNOSIS — N63 Unspecified lump in unspecified breast: Secondary | ICD-10-CM

## 2015-12-24 ENCOUNTER — Other Ambulatory Visit: Payer: Managed Care, Other (non HMO)

## 2015-12-24 ENCOUNTER — Ambulatory Visit: Payer: Managed Care, Other (non HMO) | Attending: Nurse Practitioner

## 2016-01-23 ENCOUNTER — Other Ambulatory Visit: Payer: Managed Care, Other (non HMO)

## 2016-01-23 ENCOUNTER — Ambulatory Visit: Payer: Managed Care, Other (non HMO) | Attending: Nurse Practitioner

## 2016-11-11 ENCOUNTER — Ambulatory Visit: Payer: Managed Care, Other (non HMO) | Admitting: Gastroenterology

## 2017-01-01 ENCOUNTER — Ambulatory Visit: Payer: Managed Care, Other (non HMO) | Admitting: Gastroenterology

## 2017-01-01 ENCOUNTER — Encounter: Payer: Self-pay | Admitting: Gastroenterology

## 2017-02-03 ENCOUNTER — Other Ambulatory Visit: Payer: Self-pay | Admitting: Nurse Practitioner

## 2017-02-03 DIAGNOSIS — Z1231 Encounter for screening mammogram for malignant neoplasm of breast: Secondary | ICD-10-CM

## 2017-02-04 ENCOUNTER — Other Ambulatory Visit: Payer: Self-pay | Admitting: Nurse Practitioner

## 2017-02-04 DIAGNOSIS — Z1231 Encounter for screening mammogram for malignant neoplasm of breast: Secondary | ICD-10-CM

## 2017-02-04 DIAGNOSIS — N63 Unspecified lump in unspecified breast: Secondary | ICD-10-CM

## 2017-03-12 ENCOUNTER — Ambulatory Visit
Admission: RE | Admit: 2017-03-12 | Discharge: 2017-03-12 | Disposition: A | Discharge status: Home / Self Care | Payer: Managed Care, Other (non HMO) | Source: Ambulatory Visit | Attending: Nurse Practitioner | Admitting: Nurse Practitioner

## 2017-03-12 DIAGNOSIS — R928 Other abnormal and inconclusive findings on diagnostic imaging of breast: Secondary | ICD-10-CM | POA: Diagnosis present

## 2017-03-12 DIAGNOSIS — Z1231 Encounter for screening mammogram for malignant neoplasm of breast: Secondary | ICD-10-CM

## 2017-03-12 DIAGNOSIS — N63 Unspecified lump in unspecified breast: Secondary | ICD-10-CM

## 2017-03-12 DIAGNOSIS — Z9889 Other specified postprocedural states: Secondary | ICD-10-CM | POA: Insufficient documentation

## 2017-03-12 DIAGNOSIS — N6320 Unspecified lump in the left breast, unspecified quadrant: Secondary | ICD-10-CM | POA: Diagnosis present

## 2017-12-28 ENCOUNTER — Other Ambulatory Visit: Payer: Self-pay | Admitting: Nurse Practitioner

## 2017-12-28 DIAGNOSIS — Z1231 Encounter for screening mammogram for malignant neoplasm of breast: Secondary | ICD-10-CM

## 2018-03-15 ENCOUNTER — Ambulatory Visit
Admission: RE | Admit: 2018-03-15 | Discharge: 2018-03-15 | Disposition: A | Discharge status: Home / Self Care | Payer: Managed Care, Other (non HMO) | Source: Ambulatory Visit | Attending: Nurse Practitioner | Admitting: Nurse Practitioner

## 2018-03-15 DIAGNOSIS — Z1231 Encounter for screening mammogram for malignant neoplasm of breast: Secondary | ICD-10-CM

## 2018-04-16 ENCOUNTER — Telehealth: Payer: Self-pay | Admitting: Neurology

## 2018-04-16 ENCOUNTER — Ambulatory Visit: Payer: Managed Care, Other (non HMO) | Admitting: Neurology

## 2018-04-16 NOTE — Telephone Encounter (Signed)
This patient did not show for a new patient appointment today. 

## 2018-04-19 ENCOUNTER — Encounter: Payer: Self-pay | Admitting: Neurology

## 2018-07-15 ENCOUNTER — Other Ambulatory Visit: Payer: Self-pay | Admitting: Acute Care

## 2018-07-15 DIAGNOSIS — G43009 Migraine without aura, not intractable, without status migrainosus: Secondary | ICD-10-CM

## 2018-08-18 ENCOUNTER — Ambulatory Visit: Admission: RE | Admit: 2018-08-18 | Payer: Self-pay | Source: Ambulatory Visit

## 2019-02-14 ENCOUNTER — Other Ambulatory Visit: Payer: Self-pay | Admitting: Nurse Practitioner

## 2019-03-02 ENCOUNTER — Other Ambulatory Visit: Payer: Self-pay | Admitting: Nurse Practitioner

## 2019-03-02 ENCOUNTER — Other Ambulatory Visit: Payer: Self-pay

## 2019-03-02 DIAGNOSIS — Z20822 Contact with and (suspected) exposure to covid-19: Secondary | ICD-10-CM

## 2019-03-02 DIAGNOSIS — Z1231 Encounter for screening mammogram for malignant neoplasm of breast: Secondary | ICD-10-CM

## 2019-03-04 LAB — NOVEL CORONAVIRUS, NAA: SARS-CoV-2, NAA: NOT DETECTED

## 2019-03-17 ENCOUNTER — Ambulatory Visit
Admission: RE | Admit: 2019-03-17 | Discharge: 2019-03-17 | Disposition: A | Discharge status: Home / Self Care | Payer: No Typology Code available for payment source | Source: Ambulatory Visit | Attending: Nurse Practitioner | Admitting: Nurse Practitioner

## 2019-03-17 DIAGNOSIS — Z1231 Encounter for screening mammogram for malignant neoplasm of breast: Secondary | ICD-10-CM | POA: Diagnosis present

## 2020-02-15 ENCOUNTER — Other Ambulatory Visit: Payer: Self-pay | Admitting: Nurse Practitioner

## 2020-02-15 DIAGNOSIS — Z1231 Encounter for screening mammogram for malignant neoplasm of breast: Secondary | ICD-10-CM

## 2020-02-16 ENCOUNTER — Other Ambulatory Visit (HOSPITAL_COMMUNITY): Payer: Self-pay | Admitting: Nurse Practitioner

## 2020-02-16 ENCOUNTER — Other Ambulatory Visit: Payer: Self-pay | Admitting: Nurse Practitioner

## 2020-02-16 DIAGNOSIS — N9412 Deep dyspareunia: Secondary | ICD-10-CM

## 2020-02-24 ENCOUNTER — Ambulatory Visit
Admission: RE | Admit: 2020-02-24 | Discharge: 2020-02-24 | Disposition: A | Discharge status: Home / Self Care | Payer: PRIVATE HEALTH INSURANCE | Source: Ambulatory Visit | Attending: Nurse Practitioner | Admitting: Nurse Practitioner

## 2020-02-24 ENCOUNTER — Other Ambulatory Visit: Payer: Self-pay

## 2020-02-24 DIAGNOSIS — N9412 Deep dyspareunia: Secondary | ICD-10-CM | POA: Diagnosis present

## 2020-05-12 ENCOUNTER — Other Ambulatory Visit: Payer: No Typology Code available for payment source

## 2021-08-04 ENCOUNTER — Emergency Department: Payer: BC Managed Care – PPO

## 2021-08-04 ENCOUNTER — Emergency Department
Admission: EM | Admit: 2021-08-04 | Discharge: 2021-08-04 | Disposition: A | Discharge status: Home / Self Care | Payer: BC Managed Care – PPO | Attending: Emergency Medicine | Admitting: Emergency Medicine

## 2021-08-04 ENCOUNTER — Other Ambulatory Visit: Payer: Self-pay

## 2021-08-04 DIAGNOSIS — R202 Paresthesia of skin: Secondary | ICD-10-CM | POA: Diagnosis present

## 2021-08-04 DIAGNOSIS — R200 Anesthesia of skin: Secondary | ICD-10-CM | POA: Diagnosis not present

## 2021-08-04 DIAGNOSIS — R0789 Other chest pain: Secondary | ICD-10-CM | POA: Insufficient documentation

## 2021-08-04 DIAGNOSIS — I10 Essential (primary) hypertension: Secondary | ICD-10-CM | POA: Diagnosis not present

## 2021-08-04 DIAGNOSIS — R519 Headache, unspecified: Secondary | ICD-10-CM | POA: Diagnosis not present

## 2021-08-04 LAB — BASIC METABOLIC PANEL
Anion gap: 6 (ref 5–15)
BUN: 21 mg/dL — ABNORMAL HIGH (ref 6–20)
CO2: 28 mmol/L (ref 22–32)
Calcium: 9.4 mg/dL (ref 8.9–10.3)
Chloride: 103 mmol/L (ref 98–111)
Creatinine, Ser: 0.76 mg/dL (ref 0.44–1.00)
GFR, Estimated: >60 mL/min (ref >60)
Glucose, Bld: 127 mg/dL — ABNORMAL HIGH (ref 70–99)
Potassium: 4.2 mmol/L (ref 3.5–5.1)
Sodium: 137 mmol/L (ref 135–145)

## 2021-08-04 LAB — CBC
HCT: 43 % (ref 36.0–46.0)
Hemoglobin: 14 g/dL (ref 12.0–15.0)
MCH: 30.3 pg (ref 26.0–34.0)
MCHC: 32.6 g/dL (ref 30.0–36.0)
MCV: 93.1 fL (ref 80.0–100.0)
Platelets: 294 10*3/uL (ref 150–400)
RBC: 4.62 MIL/uL (ref 3.87–5.11)
RDW: 13.8 % (ref 11.5–15.5)
WBC: 8 10*3/uL (ref 4.0–10.5)
nRBC: 0 % (ref 0.0–0.2)

## 2021-08-04 LAB — TROPONIN I (HIGH SENSITIVITY): Troponin I (High Sensitivity): 5 ng/L (ref <18)

## 2021-08-04 NOTE — ED Triage Notes (Signed)
Pt comes pov with left sided chest pressure and tingling down her left arm since yesterday. Pt states this has happened before and thinks it is related to her RA medication. States last time her BP was really high and they gave her IV hydralazine. BP today is 157/100.  ?

## 2021-08-04 NOTE — ED Provider Notes (Signed)
? ?[facility] ?Provider Note ? ? ? Event Date/Time  ? First MD Initiated Contact with Patient 08/04/21 1927   ?  (approximate) ? ? ?History  ? ?Chief Complaint ?Chest Pain ? ? ?HPI ? ?Danielle Vance is a 56 y.o. female with past medical history of hypertension and rheumatoid arthritis who presents to the ED complaining of chest pain and tingling.  Patient reports that her rheumatologist recently started her on Rinvoq 5 days ago and she is concerned that she is having a reaction to this.  She states that the day after starting the medicine she woke up with a headache, noticed her blood pressure was high that morning.  A couple of days later, she began to notice numbness and tingling along the left side of her face, left arm, and left chest.  She denies any associated weakness, facial droop, vision changes, or speech changes.  She has had constant numbness and tingling for the past 24 to 48 hours, decided to stop the Rinvoq 2 days ago.  She reports a similar reaction when taking this medication a couple of years ago.  She reports some mild discomfort in her left chest but denies any overt pain.  She has not had any fevers, cough, or difficulty breathing. ?  ? ? ?Physical Exam  ? ?Triage Vital Signs: ?ED Triage Vitals [08/04/21 1849]  ?Enc Vitals Group  ?   BP (!) 157/100  ?   Pulse Rate 74  ?   Resp 18  ?   Temp 98.1 ?F (36.7 ?C)  ?   Temp Source Oral  ?   SpO2 97 %  ?   Weight 215 lb (97.5 kg)  ?   Height   ?   Head Circumference   ?   Peak Flow   ?   Pain Score 3  ?   Pain Loc   ?   Pain Edu?   ?   Excl. in GC?   ? ? ?Most recent vital signs: ?Vitals:  ? 08/04/21 1849 08/04/21 2100  ?BP: (!) 157/100 (!) 170/93  ?Pulse: 74 63  ?Resp: 18 17  ?Temp: 98.1 ?F (36.7 ?C)   ?SpO2: 97% 96%  ? ? ?Constitutional: Alert and oriented. ?Eyes: Conjunctivae are normal. ?Head: Atraumatic. ?Nose: No congestion/rhinnorhea. ?Mouth/Throat: Mucous membranes are moist.  ?Cardiovascular: Normal rate, regular  rhythm. Grossly normal heart sounds.  2+ radial pulses bilaterally. ?Respiratory: Normal respiratory effort.  No retractions. Lungs CTAB. ?Gastrointestinal: Soft and nontender. No distention. ?Musculoskeletal: No lower extremity tenderness nor edema.  ?Neurologic:  Normal speech and language. No gross focal neurologic deficits are appreciated. ? ? ? ?ED Results / Procedures / Treatments  ? ?Labs ?(all labs ordered are listed, but only abnormal results are displayed) ?Labs Reviewed  ?BASIC METABOLIC PANEL - Abnormal; Notable for the following components:  ?    Result Value  ? Glucose, Bld 127 (*)   ? BUN 21 (*)   ? All other components within normal limits  ?CBC  ?TROPONIN I (HIGH SENSITIVITY)  ? ? ? ?EKG ? ?ED ECG REPORT ?Harriet Masson, the attending physician, personally viewed and interpreted this ECG. ? ? Date: 08/04/2021 ? EKG Time: 18:46 ? Rate: 72 ? Rhythm: normal sinus rhythm ? Axis: LAD ? Intervals:none ? ST&T Change: None ? ?RADIOLOGY ?Chest x-ray reviewed by me with no infiltrate, edema, or effusion. ? ?PROCEDURES: ? ?Critical Care performed: No ? ?Procedures ? ? ?MEDICATIONS ORDERED IN ED: ?Medications -  No data to display ? ? ?IMPRESSION / MDM / ASSESSMENT AND PLAN / ED COURSE  ?I reviewed the triage vital signs and the nursing notes. ?             ?               ? ?56 y.o. female with past medical history of hypertension and rheumatoid arthritis who presents to the ED complaining of 4 days of headache and elevated blood pressure along with 2 days of left facial, chest, and arm numbness and tingling after starting on Rinvoq. ? ?Differential diagnosis includes, but is not limited to, stroke, TIA, electrolyte abnormality, ACS, PE, pneumonia, medication side effect. ? ?Patient nontoxic-appearing and in no acute distress, vital signs are remarkable only for mildly elevated blood pressure.  Patient with no focal neurologic deficits on exam and with isolated sensory symptoms and no focal weakness, low  suspicion for stroke.  Given her headache, we will screen CT head.  She does report some discomfort going into the left side of her chest however symptoms are very atypical for ACS.  EKG shows no evidence of arrhythmia or ischemia and troponin is negative, no repeat needed given constant symptoms for at least 24 hours.  Remainder of labs are reassuring with BMP showing no AKI or electrolyte abnormality, CBC without anemia or leukocytosis. ? ?CT head is negative for acute process, no evidence of stroke or intracranial hemorrhage.  Very low suspicion for stroke at this time given tingling without objective numbness.  Symptoms likely related to recent medication change and she is appropriate for discharge home with PCP follow-up for recheck of her blood pressure.  She was counseled to return to the ED for new worsening symptoms, patient agrees with plan. ? ?  ? ? ?FINAL CLINICAL IMPRESSION(S) / ED DIAGNOSES  ? ?Final diagnoses:  ?Paresthesia  ?Hypertension, unspecified type  ? ? ? ?Rx / DC Orders  ? ?ED Discharge Orders   ? ? None  ? ?  ? ? ? ?Note:  This document was prepared using Dragon voice recognition software and may include unintentional dictation errors. ?  ?Chesley NoonJessup, Jahnavi Muratore, MD ?08/04/21 2132 ? ?

## 2022-10-20 ENCOUNTER — Other Ambulatory Visit: Payer: Self-pay | Admitting: Nurse Practitioner

## 2022-12-04 ENCOUNTER — Other Ambulatory Visit: Payer: Self-pay

## 2022-12-04 MED ORDER — LOSARTAN POTASSIUM 100 MG PO TABS: 100.0000 mg | ORAL_TABLET | Freq: Every day | ORAL | 1 refills | Status: DC

## 2022-12-05 ENCOUNTER — Other Ambulatory Visit: Payer: Self-pay

## 2022-12-08 ENCOUNTER — Other Ambulatory Visit: Payer: Self-pay

## 2022-12-08 ENCOUNTER — Other Ambulatory Visit: Payer: Self-pay | Admitting: Internal Medicine

## 2022-12-09 ENCOUNTER — Other Ambulatory Visit: Payer: Self-pay

## 2022-12-09 MED ORDER — LOSARTAN POTASSIUM 100 MG PO TABS: 100.0000 mg | ORAL_TABLET | Freq: Every day | ORAL | 1 refills | Status: DC

## 2023-01-16 ENCOUNTER — Other Ambulatory Visit: Payer: Self-pay | Admitting: Internal Medicine

## 2023-01-19 ENCOUNTER — Other Ambulatory Visit: Payer: Self-pay | Admitting: Internal Medicine

## 2023-01-19 MED ORDER — ESCITALOPRAM OXALATE 20 MG PO TABS: 20.0000 mg | ORAL_TABLET | Freq: Every day | ORAL | 0 refills | Status: DC

## 2023-01-22 ENCOUNTER — Ambulatory Visit (INDEPENDENT_AMBULATORY_CARE_PROVIDER_SITE_OTHER): Payer: BC Managed Care – PPO | Admitting: Cardiology

## 2023-01-22 ENCOUNTER — Encounter: Payer: Self-pay | Admitting: Cardiology

## 2023-01-22 VITALS — BP 130/79 | HR 78 | Ht 66.0 in | Wt 235.2 lb

## 2023-01-22 DIAGNOSIS — Z131 Encounter for screening for diabetes mellitus: Secondary | ICD-10-CM

## 2023-01-22 DIAGNOSIS — Z1329 Encounter for screening for other suspected endocrine disorder: Secondary | ICD-10-CM

## 2023-01-22 DIAGNOSIS — Z13220 Encounter for screening for lipoid disorders: Secondary | ICD-10-CM

## 2023-01-22 DIAGNOSIS — Z1211 Encounter for screening for malignant neoplasm of colon: Secondary | ICD-10-CM | POA: Diagnosis not present

## 2023-01-22 DIAGNOSIS — Z1231 Encounter for screening mammogram for malignant neoplasm of breast: Secondary | ICD-10-CM | POA: Diagnosis not present

## 2023-01-22 DIAGNOSIS — I10 Essential (primary) hypertension: Secondary | ICD-10-CM | POA: Diagnosis not present

## 2023-01-22 MED ORDER — ESCITALOPRAM OXALATE 20 MG PO TABS: 20.0000 mg | ORAL_TABLET | Freq: Every day | ORAL | 3 refills | Status: DC

## 2023-01-22 MED ORDER — PANTOPRAZOLE SODIUM 40 MG PO TBEC: 40.0000 mg | DELAYED_RELEASE_TABLET | Freq: Every day | ORAL | 3 refills | Status: DC

## 2023-01-22 NOTE — Progress Notes (Signed)
Established Patient Office Visit  Subjective:  Patient ID: Danielle Vance, female    DOB: Nov 09, 1965  Age: 57 y.o. MRN: [id number]  Chief Complaint  Patient presents with   Follow-up    Refills    Patient in office needing refills on her medications. Patient reports feeling well, no complaints today. Medications refilled.  Overdue for mammogram, order sent.  Overdue for colonoscopy. Patient has seen Dr. Servando Snare before, will refer back to him.  Will return for fasting lab work.  Pap smear negative 02/15/2020.    No other concerns at this time.   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   TONSILLECTOMY  2003    Social History   Socioeconomic History   Marital status: Married    Spouse name: Not on file   Number of children: Not on file   Years of education: Not on file   Highest education level: Not on file  Occupational History   Not on file  Tobacco Use   Smoking status: Never   Smokeless tobacco: Never  Substance and Sexual Activity   Alcohol use: No   Drug use: No   Sexual activity: Not on file  Other Topics Concern   Not on file  Social History Narrative   Not on file   Social Determinants of Health   Financial Resource Strain: Not on file  Food Insecurity: Not on file  Transportation Needs: Not on file  Physical Activity: Not on file  Stress: Not on file  Social Connections: Not on file  Intimate Partner Violence: Not on file    Family History  Problem Relation Age of Onset   Breast cancer Neg Hx     No Known Allergies  Review of Systems  Constitutional: Negative.   HENT: Negative.    Eyes: Negative.   Respiratory: Negative.  Negative for shortness of breath.    Cardiovascular: Negative.  Negative for chest pain.  Gastrointestinal: Negative.  Negative for abdominal pain, constipation and diarrhea.  Genitourinary: Negative.   Musculoskeletal:  Negative for joint pain and myalgias.  Skin: Negative.   Neurological: Negative.  Negative for dizziness and headaches.  Endo/Heme/Allergies: Negative.   All other systems reviewed and are negative.      Objective:   BP 130/79   Pulse 78   Ht 5\' 6"  (1.676 m)   Wt 235 lb 3.2 oz (106.7 kg)   SpO2 97%   BMI 37.96 kg/m   Vitals:   01/22/23 1401  BP: 130/79  Pulse: 78  Height: 5\' 6"  (1.676 m)  Weight: 235 lb 3.2 oz (106.7 kg)  SpO2: 97%  BMI (Calculated): 37.98    Physical Exam Vitals and nursing note reviewed.  Constitutional:      Appearance: Normal appearance. She is normal weight.  HENT:     Head: Normocephalic and atraumatic.     Nose: Nose normal.     Mouth/Throat:     Mouth: Mucous membranes are moist.  Eyes:     Extraocular Movements: Extraocular movements intact.     Conjunctiva/sclera: Conjunctivae normal.     Pupils: Pupils are equal, round, and reactive to light.  Cardiovascular:  Rate and Rhythm: Normal rate and regular rhythm.     Pulses: Normal pulses.     Heart sounds: Normal heart sounds.  Pulmonary:     Effort: Pulmonary effort is normal.     Breath sounds: Normal breath sounds.  Abdominal:     General: Abdomen is flat. Bowel sounds are normal.     Palpations: Abdomen is soft.  Musculoskeletal:        General: Normal range of motion.     Cervical back: Normal range of motion.  Skin:    General: Skin is warm and dry.  Neurological:     General: No focal deficit present.     Mental Status: She is alert and oriented to person, place, and time.  Psychiatric:        Mood and Affect: Mood normal.        Behavior: Behavior normal.        Thought Content: Thought content normal.        Judgment: Judgment normal.      No results found for any visits on  01/22/23.  No results found for this or any previous visit (from the past 2160 hour(s)).    Assessment & Plan:  Return for fasting lab work.  Mammogram order sent.  Colonoscopy referral sent.  Medications refilled.   Problem List Items Addressed This Visit       Cardiovascular and Mediastinum   HTN (hypertension) - Primary   Other Visit Diagnoses     Encounter for screening mammogram for malignant neoplasm of breast       Relevant Orders   MM 3D SCREENING MAMMOGRAM BILATERAL BREAST   Colon cancer screening       Relevant Orders   Ambulatory referral to Gastroenterology   Diabetes mellitus screening       Relevant Orders   Hemoglobin A1c   CMP14+EGFR   Thyroid disorder screening       Relevant Orders   TSH   Lipid screening       Relevant Orders   Lipid Profile       Return in about 4 months (around 05/25/2023).   Total time spent: 25 minutes  [employer], NP  01/22/2023   This document may have been prepared by Dragon Voice Recognition software and as such may include unintentional dictation errors.

## 2023-01-23 ENCOUNTER — Telehealth: Payer: Self-pay

## 2023-01-23 NOTE — Telephone Encounter (Signed)
The patient called back to schedule her colonoscopy. I informed her to that she will have to cancel the appointment with [facility].

## 2023-02-02 ENCOUNTER — Telehealth: Payer: Self-pay

## 2023-02-02 ENCOUNTER — Other Ambulatory Visit: Payer: Self-pay

## 2023-02-02 ENCOUNTER — Encounter: Payer: Self-pay | Admitting: *Deleted

## 2023-02-02 DIAGNOSIS — Z800 Family history of malignant neoplasm of digestive organs: Secondary | ICD-10-CM

## 2023-02-02 DIAGNOSIS — Z1211 Encounter for screening for malignant neoplasm of colon: Secondary | ICD-10-CM

## 2023-02-02 MED ORDER — NA SULFATE-K SULFATE-MG SULF 17.5-3.13-1.6 GM/177ML PO SOLN: 1.0000 | Freq: Once | ORAL | 0 refills | Status: AC

## 2023-02-02 NOTE — Telephone Encounter (Signed)
Gastroenterology Pre-Procedure Review  Request Date: 02/18/23 Requesting Physician: Dr. Allegra Lai  PATIENT REVIEW QUESTIONS: The patient responded to the following health history questions as indicated:    1. Are you having any GI issues? no 2. Do you have a personal history of Polyps? yes (last colonoscopy 5-6 years ago with [facility]) 3. Do you have a family history of Colon Cancer or Polyps? yes (mother and maternal aunt colon cancer) 4. Diabetes Mellitus? no 5. Joint replacements in the past 12 months?no 6. Major health problems in the past 3 months?no 7. Any artificial heart valves, MVP, or defibrillator?no    MEDICATIONS & ALLERGIES:    Patient reports the following regarding taking any anticoagulation/antiplatelet therapy:   Plavix, Coumadin, Eliquis, Xarelto, Lovenox, Pradaxa, Brilinta, or Effient? no Aspirin? no  Patient confirms/reports the following medications:  Current Outpatient Medications  Medication Sig Dispense Refill   amLODipine (NORVASC) 5 MG tablet TAKE 1 TABLET BY MOUTH DAILY 90 tablet 2   CIPRODEX OTIC suspension      escitalopram (LEXAPRO) 20 MG tablet Take 1 tablet (20 mg total) by mouth daily. 90 tablet 3   folic acid (FOLVITE) 1 MG tablet folic acid 1 mg tablet     HYDROcodone-acetaminophen (NORCO/VICODIN) 5-325 MG tablet hydrocodone 5 mg-acetaminophen 325 mg tablet     losartan (COZAAR) 100 MG tablet Take 1 tablet (100 mg total) by mouth daily. 90 tablet 1   methotrexate (RHEUMATREX) 2.5 MG tablet TAKE 8 TABLETS BY MOUTH (20MG ) EVERY 7 DAYS     pantoprazole (PROTONIX) 40 MG tablet Take 1 tablet (40 mg total) by mouth daily. 90 tablet 3   No current facility-administered medications for this visit.    Patient confirms/reports the following allergies:  No Known Allergies  No orders of the defined types were placed in this encounter.   AUTHORIZATION INFORMATION Primary Insurance: 1D#: Group #:  Secondary Insurance: 1D#: Group #:  SCHEDULE  INFORMATION: Date:  Time: Location:

## 2023-02-12 ENCOUNTER — Other Ambulatory Visit: Payer: BC Managed Care – PPO

## 2023-02-13 LAB — CMP14+EGFR
ALT: 17 [IU]/L (ref 0–32)
AST: 18 [IU]/L (ref 0–40)
Albumin: 4.2 g/dL (ref 3.8–4.9)
Alkaline Phosphatase: 106 [IU]/L (ref 44–121)
BUN/Creatinine Ratio: 24 — ABNORMAL HIGH (ref 9–23)
BUN: 16 mg/dL (ref 6–24)
Bilirubin Total: 0.4 mg/dL (ref 0.0–1.2)
CO2: 25 mmol/L (ref 20–29)
Calcium: 9.7 mg/dL (ref 8.7–10.2)
Chloride: 104 mmol/L (ref 96–106)
Creatinine, Ser: 0.68 mg/dL (ref 0.57–1.00)
Globulin, Total: 3.1 g/dL (ref 1.5–4.5)
Glucose: 98 mg/dL (ref 70–99)
Potassium: 5 mmol/L (ref 3.5–5.2)
Sodium: 143 mmol/L (ref 134–144)
Total Protein: 7.3 g/dL (ref 6.0–8.5)
eGFR: 103 mL/min/{1.73_m2} (ref >59)

## 2023-02-13 LAB — LIPID PANEL
Chol/HDL Ratio: 3 {ratio} (ref 0.0–4.4)
Cholesterol, Total: 212 mg/dL — ABNORMAL HIGH (ref 100–199)
HDL: 70 mg/dL (ref >39)
LDL Chol Calc (NIH): 124 mg/dL — ABNORMAL HIGH (ref 0–99)
Triglycerides: 103 mg/dL (ref 0–149)
VLDL Cholesterol Cal: 18 mg/dL (ref 5–40)

## 2023-02-13 LAB — HEMOGLOBIN A1C
Est. average glucose Bld gHb Est-mCnc: 111 mg/dL
Hgb A1c MFr Bld: 5.5 % (ref 4.8–5.6)

## 2023-02-13 LAB — TSH: TSH: 1.78 u[IU]/mL (ref 0.450–4.500)

## 2023-02-17 ENCOUNTER — Telehealth: Payer: Self-pay

## 2023-02-17 NOTE — Telephone Encounter (Signed)
Pt LM asking for call back.

## 2023-02-18 ENCOUNTER — Encounter: Payer: Self-pay | Admitting: Gastroenterology

## 2023-02-18 ENCOUNTER — Ambulatory Visit: Payer: BC Managed Care – PPO | Admitting: Anesthesiology

## 2023-02-18 ENCOUNTER — Encounter
Admission: RE | Disposition: A | Discharge status: Home / Self Care | Payer: Self-pay | Source: Home / Self Care | Attending: Gastroenterology

## 2023-02-18 ENCOUNTER — Ambulatory Visit
Admission: RE | Admit: 2023-02-18 | Discharge: 2023-02-18 | Disposition: A | Discharge status: Home / Self Care | Payer: BC Managed Care – PPO | Attending: Gastroenterology | Admitting: Gastroenterology

## 2023-02-18 DIAGNOSIS — Z1211 Encounter for screening for malignant neoplasm of colon: Secondary | ICD-10-CM | POA: Insufficient documentation

## 2023-02-18 DIAGNOSIS — Z6837 Body mass index (BMI) 37.0-37.9, adult: Secondary | ICD-10-CM | POA: Diagnosis not present

## 2023-02-18 DIAGNOSIS — F419 Anxiety disorder, unspecified: Secondary | ICD-10-CM | POA: Insufficient documentation

## 2023-02-18 DIAGNOSIS — M069 Rheumatoid arthritis, unspecified: Secondary | ICD-10-CM | POA: Diagnosis not present

## 2023-02-18 DIAGNOSIS — I10 Essential (primary) hypertension: Secondary | ICD-10-CM | POA: Diagnosis not present

## 2023-02-18 DIAGNOSIS — Z713 Dietary counseling and surveillance: Secondary | ICD-10-CM

## 2023-02-18 DIAGNOSIS — D128 Benign neoplasm of rectum: Secondary | ICD-10-CM | POA: Insufficient documentation

## 2023-02-18 DIAGNOSIS — Z800 Family history of malignant neoplasm of digestive organs: Secondary | ICD-10-CM | POA: Diagnosis not present

## 2023-02-18 DIAGNOSIS — E669 Obesity, unspecified: Secondary | ICD-10-CM | POA: Diagnosis not present

## 2023-02-18 DIAGNOSIS — Z9884 Bariatric surgery status: Secondary | ICD-10-CM | POA: Insufficient documentation

## 2023-02-18 DIAGNOSIS — K621 Rectal polyp: Secondary | ICD-10-CM

## 2023-02-18 HISTORY — PX: COLONOSCOPY WITH PROPOFOL: SHX5780

## 2023-02-18 HISTORY — DX: Cardiac murmur, unspecified: R01.1

## 2023-02-18 HISTORY — PX: POLYPECTOMY: SHX5525

## 2023-02-18 SURGERY — COLONOSCOPY WITH PROPOFOL
Anesthesia: General

## 2023-02-18 MED ORDER — STERILE WATER FOR IRRIGATION IR SOLN
Status: DC | PRN
  Administered 2023-02-18: 50 mL

## 2023-02-18 MED ORDER — PROPOFOL 10 MG/ML IV BOLUS
INTRAVENOUS | Status: DC | PRN
  Administered 2023-02-18: 60 mg via INTRAVENOUS

## 2023-02-18 MED ORDER — MIDAZOLAM HCL 2 MG/2ML IJ SOLN
INTRAMUSCULAR | Status: AC
  Filled 2023-02-18: qty 2

## 2023-02-18 MED ORDER — MIDAZOLAM HCL 2 MG/2ML IJ SOLN
INTRAMUSCULAR | Status: DC | PRN
  Administered 2023-02-18: 2 mg via INTRAVENOUS

## 2023-02-18 MED ORDER — LIDOCAINE HCL (CARDIAC) PF 100 MG/5ML IV SOSY
PREFILLED_SYRINGE | INTRAVENOUS | Status: DC | PRN
  Administered 2023-02-18: 100 mg via INTRAVENOUS

## 2023-02-18 MED ORDER — SODIUM CHLORIDE 0.9 % IV SOLN: INTRAVENOUS | Status: DC

## 2023-02-18 MED ORDER — GLYCOPYRROLATE 0.2 MG/ML IJ SOLN
INTRAMUSCULAR | Status: DC | PRN
  Administered 2023-02-18: .2 mg via INTRAVENOUS

## 2023-02-18 MED ORDER — PROPOFOL 500 MG/50ML IV EMUL
INTRAVENOUS | Status: DC | PRN
  Administered 2023-02-18: 165 ug/kg/min via INTRAVENOUS

## 2023-02-18 NOTE — Op Note (Signed)
[facility] Gastroenterology Patient Name: Danielle Vance Procedure Date: 02/18/2023 8:34 AM MRN: [id number] Account #: [id number] Date of Birth: 27-Feb-1966 Admit Type: Outpatient Age: 57 Room: [facility] ENDO ROOM 1 Gender: Female Note Status: Finalized Instrument Name: Prentice Docker [id number] Procedure:             Colonoscopy Indications:           Screening in patient at increased risk: Colorectal                         cancer in mother before age 102, Last colonoscopy: May                         2011 Providers:             Toney Reil MD, MD Referring MD:          Margaretann Loveless, MD (Referring MD) Medicines:             General Anesthesia Complications:         No immediate complications. Estimated blood loss: None. Procedure:             Pre-Anesthesia Assessment:                        - Prior to the procedure, a History and Physical was                         performed, and patient medications and allergies were                         reviewed. The patient is competent. The risks and                         benefits of the procedure and the sedation options and                         risks were discussed with the patient. All questions                         were answered and informed consent was obtained.                         Patient identification and proposed procedure were                         verified by the physician, the nurse, the                         anesthesiologist, the anesthetist and the technician                         in the pre-procedure area in the procedure room in the                         endoscopy suite. Mental Status Examination: alert and                         oriented. Airway Examination: normal oropharyngeal  airway and neck mobility. Respiratory Examination:                         clear to auscultation. CV Examination: normal.                         Prophylactic Antibiotics: The patient  does not require                         prophylactic antibiotics. Prior Anticoagulants: The                         patient has taken no anticoagulant or antiplatelet                         agents. ASA Grade Assessment: II - A patient with mild                         systemic disease. After reviewing the risks and                         benefits, the patient was deemed in satisfactory                         condition to undergo the procedure. The anesthesia                         plan was to use general anesthesia. Immediately prior                         to administration of medications, the patient was                         re-assessed for adequacy to receive sedatives. The                         heart rate, respiratory rate, oxygen saturations,                         blood pressure, adequacy of pulmonary ventilation, and                         response to care were monitored throughout the                         procedure. The physical status of the patient was                         re-assessed after the procedure.                        After obtaining informed consent, the colonoscope was                         passed under direct vision. Throughout the procedure,                         the patient's blood pressure, pulse, and oxygen  saturations were monitored continuously. The                         Colonoscope was introduced through the anus and                         advanced to the the terminal ileum, with                         identification of the appendiceal orifice and IC                         valve. The colonoscopy was performed without                         difficulty. The patient tolerated the procedure well.                         The quality of the bowel preparation was evaluated                         using the BBPS [facility] Bowel Preparation Scale) with                         scores of: Right Colon = 3, Transverse Colon  = 3 and                         Left Colon = 3 (entire mucosa seen well with no                         residual staining, small fragments of stool or opaque                         liquid). The total BBPS score equals 9. The terminal                         ileum, ileocecal valve, appendiceal orifice, and                         rectum were photographed. Findings:      The perianal and digital rectal examinations were normal. Pertinent       negatives include normal sphincter tone and no palpable rectal lesions.      The terminal ileum appeared normal.      A 6 mm polyp was found in the rectum. The polyp was sessile. The polyp       was removed with a cold snare. Resection and retrieval were complete.       Estimated blood loss: none.      The retroflexed view of the distal rectum and anal verge was normal and       showed no anal or rectal abnormalities. Impression:            - The examined portion of the ileum was normal.                        - One 6 mm polyp in the rectum, removed with a cold  snare. Resected and retrieved.                        - The distal rectum and anal verge are normal on                         retroflexion view. Recommendation:        - Discharge patient to home (with escort).                        - Resume previous diet today.                        - Continue present medications.                        - Await pathology results.                        - Repeat colonoscopy in 5 years for surveillance. Procedure Code(s):     --- Professional ---                        [phone], Colonoscopy, flexible; with removal of                         tumor(s), polyp(s), or other lesion(s) by snare                         technique Diagnosis Code(s):     --- Professional ---                        Z80.0, Family history of malignant neoplasm of                         digestive organs                        D12.8, Benign neoplasm of rectum CPT  copyright 2022 American Medical Association. All rights reserved. The codes documented in this report are preliminary and upon coder review may  be revised to meet current compliance requirements. Dr. Libby Maw Toney Reil MD, MD 02/18/2023 9:01:29 AM This report has been signed electronically. Number of Addenda: 0 Note Initiated On: 02/18/2023 8:34 AM Scope Withdrawal Time: 0 hours 12 minutes 42 seconds  Total Procedure Duration: 0 hours 17 minutes 54 seconds  Estimated Blood Loss:  Estimated blood loss: none.      [facility]

## 2023-02-18 NOTE — H&P (Signed)
Arlyss Repress, MD [address]  [address]  [place], [place] [address]  Main: [phone]  Fax: [phone] Pager: [phone]  Primary Care Physician:  Margaretann Loveless, MD Primary Gastroenterologist:  Dr. Arlyss Repress  Pre-Procedure History & Physical: HPI:  MEOSHA CASTANON is a 57 y.o. female is here for an colonoscopy.   Past Medical History:  Diagnosis Date   Anxiety    Heart murmur    Hypertension    Rheumatoid arthritis (HCC)     Past Surgical History:  Procedure Laterality Date   ABDOMINAL HYSTERECTOMY  2000   BREAST BIOPSY Left 2014   clip marker- BENIGN BREAST TISSUE    NASAL SEPTUM SURGERY  2004   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   TONSILLECTOMY  2003    Prior to Admission medications   Medication Sig Start Date End Date Taking? Authorizing Provider  amLODipine (NORVASC) 5 MG tablet TAKE 1 TABLET BY MOUTH DAILY 10/20/22  Yes Miki Kins, FNP  escitalopram (LEXAPRO) 20 MG tablet Take 1 tablet (20 mg total) by mouth daily. 01/22/23  Yes Scoggins, Amber, NP  losartan (COZAAR) 100 MG tablet Take 1 tablet (100 mg total) by mouth daily. 12/09/22  Yes Margaretann Loveless, MD  methotrexate (RHEUMATREX) 2.5 MG tablet TAKE 8 TABLETS BY MOUTH (20MG ) EVERY 7 DAYS 12/03/16  Yes [provider]  pantoprazole (PROTONIX) 40 MG tablet Take 1 tablet (40 mg total) by mouth daily. 01/22/23  Yes Scoggins, Hospital doctor, NP  CIPRODEX OTIC suspension  10/29/16   [provider]  folic acid (FOLVITE) 1 MG tablet folic acid 1 mg tablet    [provider]  HYDROcodone-acetaminophen (NORCO/VICODIN) 5-325 MG tablet hydrocodone 5 mg-acetaminophen 325 mg tablet    [provider]    Allergies as of 02/02/2023   (No Known Allergies)    Family History  Problem Relation Age of Onset   Breast cancer Neg Hx     Social History   Socioeconomic History   Marital status: Married    Spouse name: Not on file   Number of children: Not on file   Years of  education: Not on file   Highest education level: Not on file  Occupational History   Not on file  Tobacco Use   Smoking status: Never   Smokeless tobacco: Never  Vaping Use   Vaping status: Never Used  Substance and Sexual Activity   Alcohol use: No    Comment: occas   Drug use: No   Sexual activity: Not on file  Other Topics Concern   Not on file  Social History Narrative   Not on file   Social Determinants of Health   Financial Resource Strain: Not on file  Food Insecurity: Not on file  Transportation Needs: Not on file  Physical Activity: Not on file  Stress: Not on file  Social Connections: Not on file  Intimate Partner Violence: Not on file    Review of Systems: See HPI, otherwise negative ROS  Physical Exam: BP (!) 140/92   Pulse 72   Temp 97.6 F (36.4 C) (Temporal)   Resp 18   Ht 5\' 6"  (1.676 m)   Wt 104.8 kg   SpO2 96%   BMI 37.28 kg/m  General:   Alert,  pleasant and cooperative in NAD Head:  Normocephalic and atraumatic. Neck:  Supple; no masses or thyromegaly. Lungs:  Clear throughout to auscultation.    Heart:  Regular rate and rhythm. Abdomen:  Soft, nontender and nondistended. Normal bowel sounds, without guarding, and without rebound.   Neurologic:  Alert and  oriented x4;  grossly normal neurologically.  Impression/Plan: DENALI SHARMA is here for an colonoscopy to be performed for colon cancer screening  Risks, benefits, limitations, and alternatives regarding  colonoscopy have been reviewed with the patient.  Questions have been answered.  All parties agreeable.   Lannette Donath, MD  02/18/2023, 8:29 AM

## 2023-02-18 NOTE — Anesthesia Postprocedure Evaluation (Signed)
Anesthesia Post Note  Patient: Danielle Vance  Procedure(s) Performed: COLONOSCOPY WITH PROPOFOL POLYPECTOMY  Patient location during evaluation: PACU Anesthesia Type: General Level of consciousness: awake and alert, oriented and patient cooperative Pain management: pain level controlled Vital Signs Assessment: post-procedure vital signs reviewed and stable Respiratory status: spontaneous breathing, nonlabored ventilation and respiratory function stable Cardiovascular status: blood pressure returned to baseline and stable Postop Assessment: adequate PO intake Anesthetic complications: no   No notable events documented.   Last Vitals:  Vitals:   02/18/23 0912 02/18/23 0922  BP:  125/78  Pulse: 74   Resp: 14   Temp:    SpO2: 97%     Last Pain:  Vitals:   02/18/23 0912  TempSrc:   PainSc: 0-No pain                 Reed Breech

## 2023-02-18 NOTE — Transfer of Care (Signed)
Immediate Anesthesia Transfer of Care Note  Patient: Danielle Vance  Procedure(s) Performed: COLONOSCOPY WITH PROPOFOL POLYPECTOMY  Patient Location: Endoscopy Unit  Anesthesia Type:General  Level of Consciousness: drowsy and patient cooperative  Airway & Oxygen Therapy: Patient Spontanous Breathing and Patient connected to face mask oxygen  Post-op Assessment: Report given to RN and Post -op Vital signs reviewed and stable  Post vital signs: Reviewed and stable  Last Vitals:  Vitals Value Taken Time  BP 117/74 02/18/23 0902  Temp 35.8 C 02/18/23 0902  Pulse 78 02/18/23 0902  Resp 16 02/18/23 0902  SpO2 98 % 02/18/23 0902    Last Pain:  Vitals:   02/18/23 0902  TempSrc: Temporal  PainSc: Asleep         Complications: No notable events documented.

## 2023-02-18 NOTE — Anesthesia Procedure Notes (Signed)
Procedure Name: General with mask airway Date/Time: 02/18/2023 8:37 AM  Performed by: Mohammed Kindle, CRNAPre-anesthesia Checklist: Patient identified, Emergency Drugs available, Suction available and Patient being monitored Patient Re-evaluated:Patient Re-evaluated prior to induction Oxygen Delivery Method: Simple face mask Induction Type: IV induction Placement Confirmation: positive ETCO2, CO2 detector and breath sounds checked- equal and bilateral Dental Injury: Teeth and Oropharynx as per pre-operative assessment

## 2023-02-18 NOTE — Anesthesia Preprocedure Evaluation (Addendum)
Anesthesia Evaluation  Patient identified by MRN, date of birth, ID band Patient awake    Reviewed: Allergy & Precautions, NPO status , Patient's Chart, lab work & pertinent test results  History of Anesthesia Complications Negative for: history of anesthetic complications  Airway Mallampati: IV   Neck ROM: Full    Dental no notable dental hx.    Pulmonary neg pulmonary ROS   Pulmonary exam normal breath sounds clear to auscultation       Cardiovascular hypertension, Normal cardiovascular exam Rhythm:Regular Rate:Normal     Neuro/Psych  PSYCHIATRIC DISORDERS Anxiety     negative neurological ROS     GI/Hepatic S/p gastric bypass 2014   Endo/Other  Obesity  Renal/GU negative Renal ROS     Musculoskeletal  (+) Arthritis , Osteoarthritis and Rheumatoid disorders,    Abdominal   Peds  Hematology negative hematology ROS (+)   Anesthesia Other Findings   Reproductive/Obstetrics                             Anesthesia Physical Anesthesia Plan  ASA: 2  Anesthesia Plan: General   Post-op Pain Management:    Induction: Intravenous  PONV Risk Score and Plan: 3 and Propofol infusion, TIVA and Treatment may vary due to age or medical condition  Airway Management Planned: Natural Airway  Additional Equipment:   Intra-op Plan:   Post-operative Plan:   Informed Consent: I have reviewed the patients History and Physical, chart, labs and discussed the procedure including the risks, benefits and alternatives for the proposed anesthesia with the patient or authorized representative who has indicated his/her understanding and acceptance.       Plan Discussed with: CRNA  Anesthesia Plan Comments: (LMA/GETA backup discussed.  Patient consented for risks of anesthesia including but not limited to:  - adverse reactions to medications - damage to eyes, teeth, lips or other oral mucosa -  nerve damage due to positioning  - sore throat or hoarseness - damage to heart, brain, nerves, lungs, other parts of body or loss of life  Informed patient about role of CRNA in peri- and intra-operative care.  Patient voiced understanding.)       Anesthesia Quick Evaluation

## 2023-02-19 ENCOUNTER — Encounter: Payer: Self-pay | Admitting: Gastroenterology

## 2023-02-19 LAB — SURGICAL PATHOLOGY

## 2023-02-20 ENCOUNTER — Encounter: Payer: Self-pay | Admitting: Gastroenterology

## 2023-05-15 ENCOUNTER — Other Ambulatory Visit: Payer: Self-pay | Admitting: Internal Medicine

## 2023-05-18 ENCOUNTER — Ambulatory Visit (INDEPENDENT_AMBULATORY_CARE_PROVIDER_SITE_OTHER): Payer: BC Managed Care – PPO | Admitting: Cardiology

## 2023-05-18 ENCOUNTER — Encounter: Payer: Self-pay | Admitting: Cardiology

## 2023-05-18 VITALS — BP 132/88 | HR 80 | Ht 66.0 in | Wt 239.0 lb

## 2023-05-18 DIAGNOSIS — I10 Essential (primary) hypertension: Secondary | ICD-10-CM

## 2023-05-18 DIAGNOSIS — G4733 Obstructive sleep apnea (adult) (pediatric): Secondary | ICD-10-CM | POA: Diagnosis not present

## 2023-05-18 DIAGNOSIS — Z13220 Encounter for screening for lipoid disorders: Secondary | ICD-10-CM

## 2023-05-18 DIAGNOSIS — E66812 Obesity, class 2: Secondary | ICD-10-CM | POA: Diagnosis not present

## 2023-05-18 DIAGNOSIS — Z131 Encounter for screening for diabetes mellitus: Secondary | ICD-10-CM | POA: Diagnosis not present

## 2023-05-18 DIAGNOSIS — Z1329 Encounter for screening for other suspected endocrine disorder: Secondary | ICD-10-CM

## 2023-05-18 DIAGNOSIS — Z6838 Body mass index (BMI) 38.0-38.9, adult: Secondary | ICD-10-CM

## 2023-05-18 MED ORDER — ZEPBOUND 2.5 MG/0.5ML ~~LOC~~ SOAJ: 2.5000 mg | SUBCUTANEOUS | 3 refills | Status: DC

## 2023-05-18 MED ORDER — AMLODIPINE BESYLATE 10 MG PO TABS: 10.0000 mg | ORAL_TABLET | Freq: Every day | ORAL | 1 refills | Status: DC

## 2023-05-18 NOTE — Progress Notes (Addendum)
Established Patient Office Visit  Subjective:  Patient ID: Danielle Vance, female    DOB: March 11, 1966  Age: 58 y.o. MRN: [id number]  Chief Complaint  Patient presents with   Follow-up    4 Months Follow Up    Patient in office for 4 month follow up, did not have fasting lab work done. Patient reports feeling well. No new complaints today. Will return for fasting lab work.  Patient concerned about her blood pressure, states it is elevated at home. Blood pressure today improved on recheck, still elevated. Patient maxed out on losartan. Will increase amlodipine to 10 mg daily. Patient takes amlodipine for Raynaud's syndrome as well as hypertension.  Up to date on colonoscopy.    Declines pap smear due to post complete hysterectomy.  Has not scheduled mammogram, has number, will call to schedule.  Patient requesting an injectable medication for weight loss. Patient has a history of sleep apnea, will send in Zepbound.    No other concerns at this time.   Past Medical History:  Diagnosis Date   Anxiety    Heart murmur    Hypertension    Rheumatoid arthritis (HCC)     Past Surgical History:  Procedure Laterality Date   ABDOMINAL HYSTERECTOMY  2000   BREAST BIOPSY Left 2014   clip marker- BENIGN BREAST TISSUE    COLONOSCOPY WITH PROPOFOL N/A 02/18/2023   Procedure: COLONOSCOPY WITH PROPOFOL;  Surgeon: Toney Reil, MD;  Location: [facility] ENDOSCOPY;  Service: Gastroenterology;  Laterality: N/A;   NASAL SEPTUM SURGERY  2004   POLYPECTOMY  02/18/2023   Procedure: POLYPECTOMY;  Surgeon: Toney Reil, MD;  Location: [facility] ENDOSCOPY;  Service: Gastroenterology;;   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   TONSILLECTOMY  2003    Social History   Socioeconomic History   Marital status: Married    Spouse name: Not on file   Number of children: Not on file   Years of education: Not on file   Highest education level: Not on file  Occupational History   Not on file  Tobacco Use    Smoking status: Never   Smokeless tobacco: Never  Vaping Use   Vaping status: Never Used  Substance and Sexual Activity   Alcohol use: No    Comment: occas   Drug use: No   Sexual activity: Not on file  Other Topics Concern   Not on file  Social History Narrative   Not on file   Social Drivers of Health   Financial Resource Strain: Not on file  Food Insecurity: Not on file  Transportation Needs: Not on file  Physical Activity: Not on file  Stress: Not on file  Social Connections: Not on file  Intimate Partner Violence: Not on file    Family History  Problem Relation Age of Onset   Breast cancer Neg Hx     No Known Allergies  Outpatient Medications Prior to Visit  Medication Sig   sulfaSALAzine (AZULFIDINE) 500 MG EC tablet Take 500 mg by mouth.   CIPRODEX OTIC suspension    escitalopram (LEXAPRO) 20 MG tablet Take 1 tablet (20 mg total) by mouth daily.   folic acid (FOLVITE) 1 MG tablet folic acid 1 mg tablet   HYDROcodone-acetaminophen (NORCO/VICODIN) 5-325 MG tablet hydrocodone 5 mg-acetaminophen 325 mg tablet   losartan (COZAAR) 100 MG tablet Take 1 tablet (100 mg total) by mouth daily.   methotrexate (RHEUMATREX) 2.5 MG tablet TAKE 8 TABLETS BY MOUTH (20MG ) EVERY 7 DAYS  pantoprazole (PROTONIX) 40 MG tablet Take 1 tablet (40 mg total) by mouth daily.   [DISCONTINUED] amLODipine (NORVASC) 5 MG tablet TAKE 1 TABLET BY MOUTH DAILY   No facility-administered medications prior to visit.    Review of Systems  Constitutional: Negative.   HENT: Negative.    Eyes: Negative.   Respiratory: Negative.  Negative for shortness of breath.   Cardiovascular: Negative.  Negative for chest pain.  Gastrointestinal: Negative.  Negative for abdominal pain, constipation and diarrhea.  Genitourinary: Negative.   Musculoskeletal:  Negative for joint pain and myalgias.  Skin: Negative.   Neurological: Negative.  Negative for dizziness and headaches.  Endo/Heme/Allergies:  Negative.   All other systems reviewed and are negative.      Objective:   BP 132/88 (BP Location: Right Arm, Patient Position: Sitting, Cuff Size: Large)   Pulse 80   Ht 5\' 6"  (1.676 m)   Wt 239 lb (108.4 kg)   SpO2 97%   BMI 38.58 kg/m   Vitals:   05/18/23 1003 05/18/23 1026  BP: (!) 142/100 132/88  Pulse: 80   Height: 5\' 6"  (1.676 m)   Weight: 239 lb (108.4 kg)   SpO2: 97%   BMI (Calculated): 38.59     Physical Exam Vitals and nursing note reviewed.  Constitutional:      Appearance: Normal appearance. She is normal weight.  HENT:     Head: Normocephalic and atraumatic.     Nose: Nose normal.     Mouth/Throat:     Mouth: Mucous membranes are moist.  Eyes:     Extraocular Movements: Extraocular movements intact.     Conjunctiva/sclera: Conjunctivae normal.     Pupils: Pupils are equal, round, and reactive to light.  Cardiovascular:     Rate and Rhythm: Normal rate and regular rhythm.     Pulses: Normal pulses.     Heart sounds: Normal heart sounds.  Pulmonary:     Effort: Pulmonary effort is normal.     Breath sounds: Normal breath sounds.  Abdominal:     General: Abdomen is flat. Bowel sounds are normal.     Palpations: Abdomen is soft.  Musculoskeletal:        General: Normal range of motion.     Cervical back: Normal range of motion.  Skin:    General: Skin is warm and dry.  Neurological:     General: No focal deficit present.     Mental Status: She is alert and oriented to person, place, and time.  Psychiatric:        Mood and Affect: Mood normal.        Behavior: Behavior normal.        Thought Content: Thought content normal.        Judgment: Judgment normal.      No results found for any visits on 05/18/23.  Recent Results (from the past 2160 hours)  Surgical pathology     Status: None   Collection Time: 02/18/23 12:00 AM  Result Value Ref Range   SURGICAL PATHOLOGY      SURGICAL PATHOLOGY [facility] [address], [address] [place], [place] [address] Telephone [phone] or [phone] Fax [phone]  REPORT OF SURGICAL PATHOLOGY   Accession #: [id number] Patient Name: PORSCHEA, BORYS Visit # : [id number]  MRN: [id number] Physician: Lannette Donath DOB/Age Sep 15, 1965 (Age: 81) Gender: F Collected Date: 02/18/2023 Received Date: 02/18/2023  FINAL DIAGNOSIS       1. Rectum, polyp(s),  Cold snare :       - TUBULAR ADENOMA.      - NEGATIVE FOR HIGH-GRADE DYSPLASIA AND MALIGNANCY.       DATE SIGNED OUT: 02/19/2023 ELECTRONIC SIGNATURE : Oneita Kras Md, Delice Bison , Pathologist, Electronic Signature  MICROSCOPIC DESCRIPTION  CASE COMMENTS STAINS USED IN DIAGNOSIS: H&E    CLINICAL HISTORY  SPECIMEN(S) OBTAINED 1. Rectum, polyp(s), Cold Snare  SPECIMEN COMMENTS: SPECIMEN CLINICAL INFORMATION: 1. Screening colonoscopy.  Family history of colon cancer.  Colon polyp    Gross Description 1. Received  in formalin, labeled "cold snare polyp rectum", is a 0.8 x 0.6 x 0.2 cm aggregate of multiple soft, tan tissue fragments submitted entirely in block 1A.      SMB      02/18/2023        Report signed out from the following location(s) [place]. [facility] [address], [place] [address] CLIA #: [id number]  [facility] [address] [place], [place] [address] CLIA #: [id number]       Assessment & Plan:  Return for fasting lab work Increase amlodipine to 10 mg daily Schedule mammogram Zepbound for weight loss  Problem List Items Addressed This Visit       Cardiovascular and Mediastinum   HTN (hypertension) - Primary   Relevant Medications   amLODipine (NORVASC) 10 MG tablet   Other Relevant Orders   CMP14+EGFR     Respiratory   Obstructive sleep apnea     Other   Class 2 severe obesity due to excess calories with serious comorbidity and body mass index (BMI) of 38.0 to 38.9 in adult [facility])   Relevant Medications    tirzepatide (ZEPBOUND) 2.5 MG/0.5ML Pen   Other Visit Diagnoses       Diabetes mellitus screening       Relevant Orders   Hemoglobin A1c     Thyroid disorder screening       Relevant Orders   TSH     Lipid screening       Relevant Orders   Lipid panel       Return in about 6 weeks (around 06/29/2023).   Total time spent: 25 minutes  [employer], NP  05/18/2023   This document may have been prepared by Dragon Voice Recognition software and as such may include unintentional dictation errors.

## 2023-05-25 ENCOUNTER — Ambulatory Visit: Payer: BC Managed Care – PPO | Admitting: Cardiology

## 2023-06-02 ENCOUNTER — Other Ambulatory Visit: Payer: BC Managed Care – PPO

## 2023-06-02 DIAGNOSIS — I10 Essential (primary) hypertension: Secondary | ICD-10-CM

## 2023-06-02 DIAGNOSIS — Z13220 Encounter for screening for lipoid disorders: Secondary | ICD-10-CM

## 2023-06-02 DIAGNOSIS — Z131 Encounter for screening for diabetes mellitus: Secondary | ICD-10-CM

## 2023-06-02 DIAGNOSIS — Z1329 Encounter for screening for other suspected endocrine disorder: Secondary | ICD-10-CM

## 2023-06-03 LAB — CMP14+EGFR
ALT: 14 [IU]/L (ref 0–32)
AST: 20 [IU]/L (ref 0–40)
Albumin: 4.2 g/dL (ref 3.8–4.9)
Alkaline Phosphatase: 103 [IU]/L (ref 44–121)
BUN/Creatinine Ratio: 16 (ref 9–23)
BUN: 11 mg/dL (ref 6–24)
Bilirubin Total: 0.3 mg/dL (ref 0.0–1.2)
CO2: 24 mmol/L (ref 20–29)
Calcium: 9.4 mg/dL (ref 8.7–10.2)
Chloride: 106 mmol/L (ref 96–106)
Creatinine, Ser: 0.7 mg/dL (ref 0.57–1.00)
Globulin, Total: 3.1 g/dL (ref 1.5–4.5)
Glucose: 91 mg/dL (ref 70–99)
Potassium: 4.6 mmol/L (ref 3.5–5.2)
Sodium: 143 mmol/L (ref 134–144)
Total Protein: 7.3 g/dL (ref 6.0–8.5)
eGFR: 102 mL/min/{1.73_m2} (ref >59)

## 2023-06-03 LAB — HEMOGLOBIN A1C
Est. average glucose Bld gHb Est-mCnc: 111 mg/dL
Hgb A1c MFr Bld: 5.5 % (ref 4.8–5.6)

## 2023-06-03 LAB — LIPID PANEL
Chol/HDL Ratio: 3.7 {ratio} (ref 0.0–4.4)
Cholesterol, Total: 236 mg/dL — ABNORMAL HIGH (ref 100–199)
HDL: 64 mg/dL (ref >39)
LDL Chol Calc (NIH): 156 mg/dL — ABNORMAL HIGH (ref 0–99)
Triglycerides: 91 mg/dL (ref 0–149)
VLDL Cholesterol Cal: 16 mg/dL (ref 5–40)

## 2023-06-03 LAB — TSH: TSH: 2.62 u[IU]/mL (ref 0.450–4.500)

## 2023-06-15 ENCOUNTER — Other Ambulatory Visit: Payer: Self-pay | Admitting: Internal Medicine

## 2023-06-29 ENCOUNTER — Ambulatory Visit: Payer: BC Managed Care – PPO | Admitting: Cardiology

## 2023-07-08 ENCOUNTER — Ambulatory Visit (INDEPENDENT_AMBULATORY_CARE_PROVIDER_SITE_OTHER): Admitting: Cardiology

## 2023-07-08 ENCOUNTER — Encounter: Payer: Self-pay | Admitting: Cardiology

## 2023-07-08 VITALS — BP 128/74 | HR 83 | Ht 66.0 in | Wt 231.0 lb

## 2023-07-08 DIAGNOSIS — Z713 Dietary counseling and surveillance: Secondary | ICD-10-CM

## 2023-07-08 DIAGNOSIS — E66812 Obesity, class 2: Secondary | ICD-10-CM | POA: Diagnosis not present

## 2023-07-08 DIAGNOSIS — Z6837 Body mass index (BMI) 37.0-37.9, adult: Secondary | ICD-10-CM

## 2023-07-08 DIAGNOSIS — Z0130 Encounter for examination of blood pressure without abnormal findings: Secondary | ICD-10-CM

## 2023-07-08 MED ORDER — ZEPBOUND 5 MG/0.5ML ~~LOC~~ SOAJ: 5.0000 mg | SUBCUTANEOUS | 3 refills | Status: DC

## 2023-07-08 NOTE — Progress Notes (Signed)
 Established Patient Office Visit  Subjective:  Patient ID: Danielle Vance, female    DOB: 1965-12-20  Age: 58 y.o. MRN: [id number]  Chief Complaint  Patient presents with   Follow-up    6 week follow up    Patient in office for 6 week follow up. Patient started on Zepbound at previous visit. Patient reports today she has been taking and tolerating it. Patient states she has made dietary changes and will be exercising soon. Patient weight down 9 lbs since previous visit. Also went on a cruise since previous visit. Will increase dose to 5 mg.  Has not schedule mammogram, patient will call to schedule.  Blood pressure well controlled today.    No other concerns at this time.   Past Medical History:  Diagnosis Date   Anxiety    Heart murmur    Hypertension    Rheumatoid arthritis (HCC)     Past Surgical History:  Procedure Laterality Date   ABDOMINAL HYSTERECTOMY  2000   BREAST BIOPSY Left 2014   clip marker- BENIGN BREAST TISSUE    COLONOSCOPY WITH PROPOFOL N/A 02/18/2023   Procedure: COLONOSCOPY WITH PROPOFOL;  Surgeon: Toney Reil, MD;  Location: [facility] ENDOSCOPY;  Service: Gastroenterology;  Laterality: N/A;   NASAL SEPTUM SURGERY  2004   POLYPECTOMY  02/18/2023   Procedure: POLYPECTOMY;  Surgeon: Toney Reil, MD;  Location: [facility] ENDOSCOPY;  Service: Gastroenterology;;   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   TONSILLECTOMY  2003    Social History   Socioeconomic History   Marital status: Married    Spouse name: Not on file   Number of children: Not on file   Years of education: Not on file   Highest education level: Not on file  Occupational History   Not on file  Tobacco Use   Smoking status: Never   Smokeless tobacco: Never  Vaping Use   Vaping status: Never Used  Substance and Sexual Activity   Alcohol use: No    Comment: occas   Drug use: No   Sexual activity: Not on file  Other Topics Concern   Not on file  Social History Narrative    Not on file   Social Drivers of Health   Financial Resource Strain: Not on file  Food Insecurity: Not on file  Transportation Needs: Not on file  Physical Activity: Not on file  Stress: Not on file  Social Connections: Not on file  Intimate Partner Violence: Not on file    Family History  Problem Relation Age of Onset   Breast cancer Neg Hx     No Known Allergies  Outpatient Medications Prior to Visit  Medication Sig   amLODipine (NORVASC) 10 MG tablet Take 1 tablet (10 mg total) by mouth daily.   CIPRODEX OTIC suspension    escitalopram (LEXAPRO) 20 MG tablet Take 1 tablet (20 mg total) by mouth daily.   folic acid (FOLVITE) 1 MG tablet folic acid 1 mg tablet   HYDROcodone-acetaminophen (NORCO/VICODIN) 5-325 MG tablet hydrocodone 5 mg-acetaminophen 325 mg tablet   losartan (COZAAR) 100 MG tablet TAKE 1 TABLET BY MOUTH DAILY   methotrexate (RHEUMATREX) 2.5 MG tablet TAKE 8 TABLETS BY MOUTH (20MG ) EVERY 7 DAYS   pantoprazole (PROTONIX) 40 MG tablet Take 1 tablet (40 mg total) by mouth daily.   sulfaSALAzine (AZULFIDINE) 500 MG EC tablet Take 500 mg by mouth.   tiZANidine (ZANAFLEX) 2 MG tablet Take 2 mg by mouth once.   [DISCONTINUED] tirzepatide (  ZEPBOUND) 2.5 MG/0.5ML Pen Inject 2.5 mg into the skin once a week.   No facility-administered medications prior to visit.    Review of Systems  Constitutional: Negative.   HENT: Negative.    Eyes: Negative.   Respiratory: Negative.  Negative for shortness of breath.   Cardiovascular: Negative.  Negative for chest pain.  Gastrointestinal: Negative.  Negative for abdominal pain, constipation and diarrhea.  Genitourinary: Negative.   Musculoskeletal:  Negative for joint pain and myalgias.  Skin: Negative.   Neurological: Negative.  Negative for dizziness and headaches.  Endo/Heme/Allergies: Negative.   All other systems reviewed and are negative.      Objective:   BP 128/74   Pulse 83   Ht 5\' 6"  (1.676 m)   Wt 231 lb  (104.8 kg)   SpO2 98%   BMI 37.28 kg/m   Vitals:   07/08/23 0924  BP: 128/74  Pulse: 83  Height: 5\' 6"  (1.676 m)  Weight: 231 lb (104.8 kg)  SpO2: 98%  BMI (Calculated): 37.3    Physical Exam Vitals and nursing note reviewed.  Constitutional:      Appearance: Normal appearance. She is normal weight.  HENT:     Head: Normocephalic and atraumatic.     Nose: Nose normal.     Mouth/Throat:     Mouth: Mucous membranes are moist.     Pharynx: Oropharynx is clear.  Eyes:     Extraocular Movements: Extraocular movements intact.     Conjunctiva/sclera: Conjunctivae normal.     Pupils: Pupils are equal, round, and reactive to light.  Cardiovascular:     Rate and Rhythm: Normal rate and regular rhythm.     Pulses: Normal pulses.     Heart sounds: Normal heart sounds.  Pulmonary:     Effort: Pulmonary effort is normal.     Breath sounds: Normal breath sounds.  Abdominal:     General: Abdomen is flat. Bowel sounds are normal.     Palpations: Abdomen is soft.  Musculoskeletal:        General: Normal range of motion.     Cervical back: Normal range of motion and neck supple.     Right lower leg: No edema.     Left lower leg: No edema.  Skin:    General: Skin is warm and dry.  Neurological:     General: No focal deficit present.     Mental Status: She is alert and oriented to person, place, and time. Mental status is at baseline.  Psychiatric:        Mood and Affect: Mood normal.        Behavior: Behavior normal.        Thought Content: Thought content normal.        Judgment: Judgment normal.      No results found for any visits on 07/08/23.  Recent Results (from the past 2160 hours)  TSH     Status: None   Collection Time: 06/02/23 10:47 AM  Result Value Ref Range   TSH 2.620 0.450 - 4.500 uIU/mL  Lipid panel     Status: Abnormal   Collection Time: 06/02/23 10:47 AM  Result Value Ref Range   Cholesterol, Total 236 (H) 100 - 199 mg/dL   Triglycerides 91 0 - 149  mg/dL   HDL 64 >16 mg/dL   VLDL Cholesterol Cal 16 5 - 40 mg/dL   LDL Chol Calc (NIH) 109 (H) 0 - 99 mg/dL   Chol/HDL Ratio 3.7 0.0 -  4.4 ratio    Comment:                                   T. Chol/HDL Ratio                                             Men  Women                               1/2 Avg.Risk  3.4    3.3                                   Avg.Risk  5.0    4.4                                2X Avg.Risk  9.6    7.1                                3X Avg.Risk 23.4   11.0   Hemoglobin A1c     Status: None   Collection Time: 06/02/23 10:47 AM  Result Value Ref Range   Hgb A1c MFr Bld 5.5 4.8 - 5.6 %    Comment:          Prediabetes: 5.7 - 6.4          Diabetes: >6.4          Glycemic control for adults with diabetes: <7.0    Est. average glucose Bld gHb Est-mCnc 111 mg/dL  [id number]     Status: None   Collection Time: 06/02/23 10:47 AM  Result Value Ref Range   Glucose 91 70 - 99 mg/dL   BUN 11 6 - 24 mg/dL   Creatinine, Ser 1.91 0.57 - 1.00 mg/dL   eGFR 478 >29 FA/OZH/0.86   BUN/Creatinine Ratio 16 9 - 23   Sodium 143 134 - 144 mmol/L   Potassium 4.6 3.5 - 5.2 mmol/L   Chloride 106 96 - 106 mmol/L   CO2 24 20 - 29 mmol/L   Calcium 9.4 8.7 - 10.2 mg/dL   Total Protein 7.3 6.0 - 8.5 g/dL   Albumin 4.2 3.8 - 4.9 g/dL   Globulin, Total 3.1 1.5 - 4.5 g/dL   Bilirubin Total 0.3 0.0 - 1.2 mg/dL   Alkaline Phosphatase 103 44 - 121 IU/L   AST 20 0 - 40 IU/L   ALT 14 0 - 32 IU/L      Assessment & Plan:  Schedule mammogram Increase Zepbound to 5 mg  Problem List Items Addressed This Visit       Other   Weight loss counseling, encounter for - Primary   Class 2 severe obesity due to excess calories with serious comorbidity and body mass index (BMI) of 37.0 to 37.9 in adult [facility])   Relevant Medications   tirzepatide (ZEPBOUND) 5 MG/0.5ML Pen    Return in about 3 months (around 10/08/2023) for with fasting labs prior.   Total time spent: 25 minutes  [employer], NP  07/08/2023   This document  may have been prepared by [employer] Voice Recognition software and as such may include unintentional dictation errors.

## 2023-07-26 ENCOUNTER — Encounter: Payer: Self-pay | Admitting: Cardiology

## 2023-07-27 ENCOUNTER — Other Ambulatory Visit: Payer: Self-pay | Admitting: Cardiology

## 2023-07-27 MED ORDER — ZEPBOUND 7.5 MG/0.5ML ~~LOC~~ SOAJ: 7.5000 mg | SUBCUTANEOUS | 3 refills | Status: DC

## 2023-07-27 NOTE — Progress Notes (Signed)
 Marland Kitchen

## 2023-09-03 ENCOUNTER — Encounter: Payer: Self-pay | Admitting: Cardiology

## 2023-09-04 ENCOUNTER — Other Ambulatory Visit: Payer: Self-pay | Admitting: Cardiology

## 2023-09-04 MED ORDER — ONDANSETRON 4 MG PO TBDP
4.0000 mg | ORAL_TABLET | Freq: Three times a day (TID) | ORAL | 0 refills | Status: AC | PRN
Start: 2023-09-04 — End: ?

## 2023-10-01 ENCOUNTER — Other Ambulatory Visit

## 2023-10-01 DIAGNOSIS — I10 Essential (primary) hypertension: Secondary | ICD-10-CM

## 2023-10-01 DIAGNOSIS — Z131 Encounter for screening for diabetes mellitus: Secondary | ICD-10-CM

## 2023-10-01 DIAGNOSIS — Z13220 Encounter for screening for lipoid disorders: Secondary | ICD-10-CM

## 2023-10-01 DIAGNOSIS — Z1329 Encounter for screening for other suspected endocrine disorder: Secondary | ICD-10-CM

## 2023-10-02 ENCOUNTER — Ambulatory Visit: Payer: Self-pay | Admitting: Cardiology

## 2023-10-02 LAB — TSH: TSH: 2.68 u[IU]/mL (ref 0.450–4.500)

## 2023-10-02 LAB — CMP14+EGFR
ALT: 15 IU/L (ref 0–32)
AST: 21 IU/L (ref 0–40)
Albumin: 4.4 g/dL (ref 3.8–4.9)
Alkaline Phosphatase: 100 IU/L (ref 44–121)
BUN/Creatinine Ratio: 21 (ref 9–23)
BUN: 16 mg/dL (ref 6–24)
Bilirubin Total: 0.4 mg/dL (ref 0.0–1.2)
CO2: 21 mmol/L (ref 20–29)
Calcium: 9.5 mg/dL (ref 8.7–10.2)
Chloride: 103 mmol/L (ref 96–106)
Creatinine, Ser: 0.77 mg/dL (ref 0.57–1.00)
Globulin, Total: 3 g/dL (ref 1.5–4.5)
Glucose: 89 mg/dL (ref 70–99)
Potassium: 4.2 mmol/L (ref 3.5–5.2)
Sodium: 141 mmol/L (ref 134–144)
Total Protein: 7.4 g/dL (ref 6.0–8.5)
eGFR: 91 mL/min/{1.73_m2} (ref >59)

## 2023-10-02 LAB — LIPID PANEL
Chol/HDL Ratio: 4.7 ratio — ABNORMAL HIGH (ref 0.0–4.4)
Cholesterol, Total: 254 mg/dL — ABNORMAL HIGH (ref 100–199)
HDL: 54 mg/dL (ref >39)
LDL Chol Calc (NIH): 183 mg/dL — ABNORMAL HIGH (ref 0–99)
Triglycerides: 97 mg/dL (ref 0–149)
VLDL Cholesterol Cal: 17 mg/dL (ref 5–40)

## 2023-10-02 LAB — HEMOGLOBIN A1C
Est. average glucose Bld gHb Est-mCnc: 85 mg/dL
Hgb A1c MFr Bld: 4.6 % — ABNORMAL LOW (ref 4.8–5.6)

## 2023-10-12 ENCOUNTER — Ambulatory Visit (INDEPENDENT_AMBULATORY_CARE_PROVIDER_SITE_OTHER): Admitting: Cardiology

## 2023-10-12 ENCOUNTER — Encounter: Payer: Self-pay | Admitting: Cardiology

## 2023-10-12 VITALS — BP 128/75 | HR 90 | Ht 66.0 in | Wt 209.0 lb

## 2023-10-12 DIAGNOSIS — I10 Essential (primary) hypertension: Secondary | ICD-10-CM

## 2023-10-12 DIAGNOSIS — E7800 Pure hypercholesterolemia, unspecified: Secondary | ICD-10-CM | POA: Insufficient documentation

## 2023-10-12 DIAGNOSIS — R5383 Other fatigue: Secondary | ICD-10-CM | POA: Diagnosis not present

## 2023-10-12 MED ORDER — WEGOVY 1 MG/0.5ML ~~LOC~~ SOAJ: 1.0000 mg | SUBCUTANEOUS | 4 refills | Status: DC

## 2023-10-12 NOTE — Progress Notes (Signed)
 Established Patient Office Visit  Subjective:  Patient ID: Danielle Vance, female    DOB: 06/05/1965  Age: 58 y.o. MRN: [id number]  Chief Complaint  Patient presents with   Follow-up    Lab results follow up    Patient in office for 3 month follow up, discuss recent lab work. Patient doing well, complains of fatigue.  Discussed recent lab work. Hgb A1c improved. LDL worse. Patient states she has been avoiding fast food, ice cream, cheese.  Taking Zepbound , due to insurance reasons, will change to [facility].  Will check CBC and Iron for fatigue.  Continue all other medications.     No other concerns at this time.   Past Medical History:  Diagnosis Date   Anxiety    Heart murmur    Hypertension    Rheumatoid arthritis (HCC)     Past Surgical History:  Procedure Laterality Date   ABDOMINAL HYSTERECTOMY  2000   BREAST BIOPSY Left 2014   clip marker- BENIGN BREAST TISSUE    COLONOSCOPY WITH PROPOFOL  N/A 02/18/2023   Procedure: COLONOSCOPY WITH PROPOFOL ;  Surgeon: Unk Corinn Skiff, MD;  Location: [facility] ENDOSCOPY;  Service: Gastroenterology;  Laterality: N/A;   NASAL SEPTUM SURGERY  2004   POLYPECTOMY  02/18/2023   Procedure: POLYPECTOMY;  Surgeon: Unk Corinn Skiff, MD;  Location: [facility] ENDOSCOPY;  Service: Gastroenterology;;   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   TONSILLECTOMY  2003    Social History   Socioeconomic History   Marital status: Married    Spouse name: Not on file   Number of children: Not on file   Years of education: Not on file   Highest education level: Not on file  Occupational History   Not on file  Tobacco Use   Smoking status: Never   Smokeless tobacco: Never  Vaping Use   Vaping status: Never Used  Substance and Sexual Activity   Alcohol use: No    Comment: occas   Drug use: No   Sexual activity: Not on file  Other Topics Concern   Not on file  Social History Narrative   Not on file   Social Drivers of Health   Financial Resource  Strain: Not on file  Food Insecurity: Not on file  Transportation Needs: Not on file  Physical Activity: Not on file  Stress: Not on file  Social Connections: Not on file  Intimate Partner Violence: Not on file    Family History  Problem Relation Age of Onset   Breast cancer Neg Hx     No Known Allergies  Outpatient Medications Prior to Visit  Medication Sig   amLODipine  (NORVASC ) 10 MG tablet Take 1 tablet (10 mg total) by mouth daily.   escitalopram  (LEXAPRO ) 20 MG tablet Take 1 tablet (20 mg total) by mouth daily.   folic acid (FOLVITE) 1 MG tablet folic acid 1 mg tablet   losartan  (COZAAR ) 100 MG tablet TAKE 1 TABLET BY MOUTH DAILY   methotrexate (RHEUMATREX) 2.5 MG tablet TAKE 8 TABLETS BY MOUTH (20MG ) EVERY 7 DAYS (Patient taking differently: 2.5 mg once a week.)   ondansetron  (ZOFRAN -ODT) 4 MG disintegrating tablet Take 1 tablet (4 mg total) by mouth every 8 (eight) hours as needed for nausea or vomiting.   pantoprazole  (PROTONIX ) 40 MG tablet Take 1 tablet (40 mg total) by mouth daily.   sulfaSALAzine (AZULFIDINE) 500 MG EC tablet Take 500 mg by mouth.   tiZANidine (ZANAFLEX) 2 MG tablet Take 2 mg by mouth once.   [  DISCONTINUED] tirzepatide  (ZEPBOUND ) 7.5 MG/0.5ML Pen Inject 7.5 mg into the skin once a week.   [DISCONTINUED] CIPRODEX OTIC suspension  (Patient not taking: Reported on 10/12/2023)   [DISCONTINUED] HYDROcodone-acetaminophen (NORCO/VICODIN) 5-325 MG tablet hydrocodone 5 mg-acetaminophen 325 mg tablet (Patient not taking: Reported on 10/12/2023)   No facility-administered medications prior to visit.    Review of Systems  Constitutional: Negative.   HENT: Negative.    Eyes: Negative.   Respiratory: Negative.  Negative for shortness of breath.   Cardiovascular: Negative.  Negative for chest pain.  Gastrointestinal: Negative.  Negative for abdominal pain, constipation and diarrhea.  Genitourinary: Negative.   Musculoskeletal:  Negative for joint pain and  myalgias.  Skin: Negative.   Neurological: Negative.  Negative for dizziness and headaches.  Endo/Heme/Allergies: Negative.   All other systems reviewed and are negative.      Objective:   BP 128/75   Pulse 90   Ht 5' 6 (1.676 m)   Wt 209 lb (94.8 kg)   SpO2 95%   BMI 33.73 kg/m   Vitals:   10/12/23 0935  BP: 128/75  Pulse: 90  Height: 5' 6 (1.676 m)  Weight: 209 lb (94.8 kg)  SpO2: 95%  BMI (Calculated): 33.75    Physical Exam Vitals and nursing note reviewed.  Constitutional:      Appearance: Normal appearance. She is normal weight.  HENT:     Head: Normocephalic and atraumatic.     Nose: Nose normal.     Mouth/Throat:     Mouth: Mucous membranes are moist.   Eyes:     Extraocular Movements: Extraocular movements intact.     Conjunctiva/sclera: Conjunctivae normal.     Pupils: Pupils are equal, round, and reactive to light.    Cardiovascular:     Rate and Rhythm: Normal rate and regular rhythm.     Pulses: Normal pulses.     Heart sounds: Normal heart sounds.  Pulmonary:     Effort: Pulmonary effort is normal.     Breath sounds: Normal breath sounds.  Abdominal:     General: Abdomen is flat. Bowel sounds are normal.     Palpations: Abdomen is soft.   Musculoskeletal:        General: Normal range of motion.     Cervical back: Normal range of motion.   Skin:    General: Skin is warm and dry.   Neurological:     General: No focal deficit present.     Mental Status: She is alert and oriented to person, place, and time.   Psychiatric:        Mood and Affect: Mood normal.        Behavior: Behavior normal.        Thought Content: Thought content normal.        Judgment: Judgment normal.      No results found for any visits on 10/12/23.  Recent Results (from the past 2160 hours)  TSH     Status: None   Collection Time: 10/01/23 10:05 AM  Result Value Ref Range   TSH 2.680 0.450 - 4.500 uIU/mL  Lipid panel     Status: Abnormal    Collection Time: 10/01/23 10:05 AM  Result Value Ref Range   Cholesterol, Total 254 (H) 100 - 199 mg/dL   Triglycerides 97 0 - 149 mg/dL   HDL 54 >60 mg/dL   VLDL Cholesterol Cal 17 5 - 40 mg/dL   LDL Chol Calc (NIH) 816 (H) 0 -  99 mg/dL   Chol/HDL Ratio 4.7 (H) 0.0 - 4.4 ratio    Comment:                                   T. Chol/HDL Ratio                                             Men  Women                               1/2 Avg.Risk  3.4    3.3                                   Avg.Risk  5.0    4.4                                2X Avg.Risk  9.6    7.1                                3X Avg.Risk 23.4   11.0   Hemoglobin A1c     Status: Abnormal   Collection Time: 10/01/23 10:05 AM  Result Value Ref Range   Hgb A1c MFr Bld 4.6 (L) 4.8 - 5.6 %    Comment:          Prediabetes: 5.7 - 6.4          Diabetes: >6.4          Glycemic control for adults with diabetes: <7.0    Est. average glucose Bld gHb Est-mCnc 85 mg/dL  RFE85+ZHQM     Status: None   Collection Time: 10/01/23 10:05 AM  Result Value Ref Range   Glucose 89 70 - 99 mg/dL   BUN 16 6 - 24 mg/dL   Creatinine, Ser 9.22 0.57 - 1.00 mg/dL   eGFR 91 >40 fO/fpw/8.26   BUN/Creatinine Ratio 21 9 - 23   Sodium 141 134 - 144 mmol/L   Potassium 4.2 3.5 - 5.2 mmol/L   Chloride 103 96 - 106 mmol/L   CO2 21 20 - 29 mmol/L   Calcium 9.5 8.7 - 10.2 mg/dL   Total Protein 7.4 6.0 - 8.5 g/dL   Albumin 4.4 3.8 - 4.9 g/dL   Globulin, Total 3.0 1.5 - 4.5 g/dL   Bilirubin Total 0.4 0.0 - 1.2 mg/dL   Alkaline Phosphatase 100 44 - 121 IU/L   AST 21 0 - 40 IU/L   ALT 15 0 - 32 IU/L      Assessment & Plan:  Blood work today Change Zepbound  to [employer]  Problem List Items Addressed This Visit       Cardiovascular and Mediastinum   HTN (hypertension) - Primary     Other   Other fatigue   Relevant Orders   CBC with Differential/Platelet   Iron, TIBC and Ferritin Panel   Elevated LDL cholesterol level    Return in about 4  months (around 02/11/2024) for fasting labs prior.   Total time spent: 25 minutes  Google, NP  10/12/2023  This document may have been prepared by [employer] Voice Recognition software and as such may include unintentional dictation errors.

## 2023-10-12 NOTE — Patient Instructions (Signed)
 [facility] [address], [address] [place],  [place]  [address]  Main: [phone]

## 2023-10-13 ENCOUNTER — Ambulatory Visit: Payer: Self-pay | Admitting: Cardiology

## 2023-10-13 LAB — CBC WITH DIFFERENTIAL/PLATELET
Basophils Absolute: 0 10*3/uL (ref 0.0–0.2)
Basos: 1 %
EOS (ABSOLUTE): 0.1 10*3/uL (ref 0.0–0.4)
Eos: 2 %
Hematocrit: 40.4 % (ref 34.0–46.6)
Hemoglobin: 13.1 g/dL (ref 11.1–15.9)
Immature Grans (Abs): 0 10*3/uL (ref 0.0–0.1)
Immature Granulocytes: 0 %
Lymphocytes Absolute: 2 10*3/uL (ref 0.7–3.1)
Lymphs: 36 %
MCH: 32.2 pg (ref 26.6–33.0)
MCHC: 32.4 g/dL (ref 31.5–35.7)
MCV: 99 fL — ABNORMAL HIGH (ref 79–97)
Monocytes Absolute: 0.4 10*3/uL (ref 0.1–0.9)
Monocytes: 7 %
Neutrophils Absolute: 3.1 10*3/uL (ref 1.4–7.0)
Neutrophils: 54 %
Platelets: 316 10*3/uL (ref 150–450)
RBC: 4.07 x10E6/uL (ref 3.77–5.28)
RDW: 13.1 % (ref 11.7–15.4)
WBC: 5.7 10*3/uL (ref 3.4–10.8)

## 2023-10-13 LAB — IRON,TIBC AND FERRITIN PANEL
Ferritin: 35 ng/mL (ref 15–150)
Iron Saturation: 30 % (ref 15–55)
Iron: 92 ug/dL (ref 27–159)
Total Iron Binding Capacity: 311 ug/dL (ref 250–450)
UIBC: 219 ug/dL (ref 131–425)

## 2023-10-15 ENCOUNTER — Encounter: Payer: Self-pay | Admitting: Cardiology

## 2023-10-23 ENCOUNTER — Other Ambulatory Visit: Payer: Self-pay

## 2023-10-23 MED ORDER — WEGOVY 1 MG/0.5ML ~~LOC~~ SOAJ
1.0000 mg | SUBCUTANEOUS | 4 refills | Status: AC
Start: 2023-10-23 — End: ?

## 2023-11-12 ENCOUNTER — Ambulatory Visit: Payer: Self-pay | Admitting: Cardiology

## 2023-11-12 ENCOUNTER — Ambulatory Visit: Admitting: Cardiology

## 2023-11-12 ENCOUNTER — Encounter: Payer: Self-pay | Admitting: Cardiology

## 2023-11-12 VITALS — BP 135/87 | HR 102 | Ht 66.0 in | Wt 204.2 lb

## 2023-11-12 DIAGNOSIS — R8290 Unspecified abnormal findings in urine: Secondary | ICD-10-CM | POA: Diagnosis not present

## 2023-11-12 DIAGNOSIS — R5383 Other fatigue: Secondary | ICD-10-CM | POA: Diagnosis not present

## 2023-11-12 DIAGNOSIS — I10 Essential (primary) hypertension: Secondary | ICD-10-CM

## 2023-11-12 LAB — POCT URINALYSIS DIPSTICK
Bilirubin, UA: 1
Blood, UA: NEGATIVE
Glucose, UA: NEGATIVE
Ketones, UA: 5
Nitrite, UA: POSITIVE
Protein, UA: POSITIVE — AB
Spec Grav, UA: >=1.03 — AB (ref 1.010–1.025)
Urobilinogen, UA: 0.2 U/dL
pH, UA: 6 (ref 5.0–8.0)

## 2023-11-12 MED ORDER — CIPROFLOXACIN HCL 500 MG PO TABS: 500.0000 mg | ORAL_TABLET | Freq: Two times a day (BID) | ORAL | 0 refills | Status: AC

## 2023-11-12 NOTE — Progress Notes (Signed)
 Established Patient Office Visit  Subjective:  Patient ID: Danielle Vance, female    DOB: [birth year]  Age: 58 y.o. MRN: [id number]  Chief Complaint  Patient presents with   Acute Visit    Poss uti    Patient in office for an acute visit, thinks she may have a UTI. Patient states symptoms started about two days ago, feeling fatigued, chills, having to urinate frequently, aching pain. Denies burning. UA today abnormal. Will send for culture. Will send in Cipro .   Urinary Tract Infection  This is a new problem. The current episode started in the past 7 days. The problem has been unchanged. The quality of the pain is described as aching. The pain is mild. Associated symptoms include chills, frequency and urgency. Pertinent negatives include no nausea or sweats. She has tried nothing for the symptoms. The treatment provided no relief.    No other concerns at this time.   Past Medical History:  Diagnosis Date   Anxiety    Heart murmur    Hypertension    Rheumatoid arthritis (HCC)     Past Surgical History:  Procedure Laterality Date   ABDOMINAL HYSTERECTOMY  2000   BREAST BIOPSY Left 2014   clip marker- BENIGN BREAST TISSUE    COLONOSCOPY WITH PROPOFOL  N/A 02/18/2023   Procedure: COLONOSCOPY WITH PROPOFOL ;  Surgeon: Unk Corinn Skiff, MD;  Location: [facility] ENDOSCOPY;  Service: Gastroenterology;  Laterality: N/A;   NASAL SEPTUM SURGERY  2004   POLYPECTOMY  02/18/2023   Procedure: POLYPECTOMY;  Surgeon: Unk Corinn Skiff, MD;  Location: [facility] ENDOSCOPY;  Service: Gastroenterology;;   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   TONSILLECTOMY  2003    Social History   Socioeconomic History   Marital status: Married    Spouse name: Not on file   Number of children: Not on file   Years of education: Not on file   Highest education level: Not on file  Occupational History   Not on file  Tobacco Use   Smoking status: Never   Smokeless tobacco: Never  Vaping Use   Vaping status:  Never Used  Substance and Sexual Activity   Alcohol use: No    Comment: occas   Drug use: No   Sexual activity: Not on file  Other Topics Concern   Not on file  Social History Narrative   Not on file   Social Drivers of Health   Financial Resource Strain: Not on file  Food Insecurity: Not on file  Transportation Needs: Not on file  Physical Activity: Not on file  Stress: Not on file  Social Connections: Not on file  Intimate Partner Violence: Not on file    Family History  Problem Relation Age of Onset   Breast cancer Neg Hx     No Known Allergies  Outpatient Medications Prior to Visit  Medication Sig   amLODipine  (NORVASC ) 10 MG tablet Take 1 tablet (10 mg total) by mouth daily.   escitalopram  (LEXAPRO ) 20 MG tablet Take 1 tablet (20 mg total) by mouth daily.   folic acid (FOLVITE) 1 MG tablet folic acid 1 mg tablet   losartan  (COZAAR ) 100 MG tablet TAKE 1 TABLET BY MOUTH DAILY   methotrexate (RHEUMATREX) 2.5 MG tablet TAKE 8 TABLETS BY MOUTH (20MG ) EVERY 7 DAYS (Patient taking differently: 2.5 mg once a week.)   ondansetron  (ZOFRAN -ODT) 4 MG disintegrating tablet Take 1 tablet (4 mg total) by mouth every 8 (eight) hours as needed for nausea or vomiting.  pantoprazole  (PROTONIX ) 40 MG tablet Take 1 tablet (40 mg total) by mouth daily.   Semaglutide -Weight Management (WEGOVY ) 1 MG/0.5ML SOAJ Inject 1 mg into the skin once a week.   sulfaSALAzine (AZULFIDINE) 500 MG EC tablet Take 500 mg by mouth.   tiZANidine (ZANAFLEX) 2 MG tablet Take 2 mg by mouth once.   No facility-administered medications prior to visit.    Review of Systems  Constitutional:  Positive for chills.  HENT: Negative.    Eyes: Negative.   Respiratory: Negative.  Negative for shortness of breath.   Cardiovascular: Negative.  Negative for chest pain.  Gastrointestinal: Negative.  Negative for abdominal pain, constipation, diarrhea and nausea.  Genitourinary:  Positive for dysuria, frequency and  urgency.  Musculoskeletal:  Negative for joint pain and myalgias.  Skin: Negative.   Neurological: Negative.  Negative for dizziness and headaches.  Endo/Heme/Allergies: Negative.   All other systems reviewed and are negative.      Objective:   BP 135/87   Pulse (!) 102   Ht 5' 6 (1.676 m)   Wt 204 lb 3.2 oz (92.6 kg)   SpO2 98%   BMI 32.96 kg/m   Vitals:   11/12/23 1353  BP: 135/87  Pulse: (!) 102  Height: 5' 6 (1.676 m)  Weight: 204 lb 3.2 oz (92.6 kg)  SpO2: 98%  BMI (Calculated): 32.97    Physical Exam Vitals and nursing note reviewed.  Constitutional:      Appearance: Normal appearance. She is normal weight.  HENT:     Head: Normocephalic and atraumatic.     Nose: Nose normal.     Mouth/Throat:     Mouth: Mucous membranes are moist.  Eyes:     Extraocular Movements: Extraocular movements intact.     Conjunctiva/sclera: Conjunctivae normal.     Pupils: Pupils are equal, round, and reactive to light.  Cardiovascular:     Rate and Rhythm: Normal rate and regular rhythm.     Pulses: Normal pulses.     Heart sounds: Normal heart sounds.  Pulmonary:     Effort: Pulmonary effort is normal.     Breath sounds: Normal breath sounds.  Abdominal:     General: Abdomen is flat. Bowel sounds are normal.     Palpations: Abdomen is soft.  Musculoskeletal:        General: Normal range of motion.     Cervical back: Normal range of motion.  Skin:    General: Skin is warm and dry.  Neurological:     General: No focal deficit present.     Mental Status: She is alert and oriented to person, place, and time.  Psychiatric:        Mood and Affect: Mood normal.        Behavior: Behavior normal.        Thought Content: Thought content normal.        Judgment: Judgment normal.      Results for orders placed or performed in visit on 11/12/23  POCT Urinalysis Dipstick (81002)  Result Value Ref Range   Color, UA yellow    Clarity, UA clear    Glucose, UA Negative  Negative   Bilirubin, UA 1    Ketones, UA 5    Spec Grav, UA >=1.030 (A) 1.010 - 1.025   Blood, UA neg    pH, UA 6.0 5.0 - 8.0   Protein, UA Positive (A) Negative   Urobilinogen, UA 0.2 0.2 or 1.0 E.U./dL   Nitrite,  UA pos    Leukocytes, UA Moderate (2+) (A) Negative   Appearance     Odor      Recent Results (from the past 2160 hours)  TSH     Status: None   Collection Time: 10/01/23 10:05 AM  Result Value Ref Range   TSH 2.680 0.450 - 4.500 uIU/mL  Lipid panel     Status: Abnormal   Collection Time: 10/01/23 10:05 AM  Result Value Ref Range   Cholesterol, Total 254 (H) 100 - 199 mg/dL   Triglycerides 97 0 - 149 mg/dL   HDL 54 >60 mg/dL   VLDL Cholesterol Cal 17 5 - 40 mg/dL   LDL Chol Calc (NIH) 816 (H) 0 - 99 mg/dL   Chol/HDL Ratio 4.7 (H) 0.0 - 4.4 ratio    Comment:                                   T. Chol/HDL Ratio                                             Men  Women                               1/2 Avg.Risk  3.4    3.3                                   Avg.Risk  5.0    4.4                                2X Avg.Risk  9.6    7.1                                3X Avg.Risk 23.4   11.0   Hemoglobin A1c     Status: Abnormal   Collection Time: 10/01/23 10:05 AM  Result Value Ref Range   Hgb A1c MFr Bld 4.6 (L) 4.8 - 5.6 %    Comment:          Prediabetes: 5.7 - 6.4          Diabetes: >6.4          Glycemic control for adults with diabetes: <7.0    Est. average glucose Bld gHb Est-mCnc 85 mg/dL  RFE85+ZHQM     Status: None   Collection Time: 10/01/23 10:05 AM  Result Value Ref Range   Glucose 89 70 - 99 mg/dL   BUN 16 6 - 24 mg/dL   Creatinine, Ser 9.22 0.57 - 1.00 mg/dL   eGFR 91 >40 fO/fpw/8.26   BUN/Creatinine Ratio 21 9 - 23   Sodium 141 134 - 144 mmol/L   Potassium 4.2 3.5 - 5.2 mmol/L   Chloride 103 96 - 106 mmol/L   CO2 21 20 - 29 mmol/L   Calcium 9.5 8.7 - 10.2 mg/dL   Total Protein 7.4 6.0 - 8.5 g/dL   Albumin 4.4 3.8 - 4.9 g/dL   Globulin, Total  3.0 1.5 - 4.5 g/dL   Bilirubin Total 0.4 0.0 -  1.2 mg/dL   Alkaline Phosphatase 100 44 - 121 IU/L   AST 21 0 - 40 IU/L   ALT 15 0 - 32 IU/L  CBC with Differential/Platelet     Status: Abnormal   Collection Time: 10/12/23 10:09 AM  Result Value Ref Range   WBC 5.7 3.4 - 10.8 x10E3/uL   RBC 4.07 3.77 - 5.28 x10E6/uL   Hemoglobin 13.1 11.1 - 15.9 g/dL   Hematocrit 59.5 65.9 - 46.6 %   MCV 99 (H) 79 - 97 fL   MCH 32.2 26.6 - 33.0 pg   MCHC 32.4 31.5 - 35.7 g/dL   RDW 86.8 88.2 - 84.5 %   Platelets 316 150 - 450 x10E3/uL   Neutrophils 54 Not Estab. %   Lymphs 36 Not Estab. %   Monocytes 7 Not Estab. %   Eos 2 Not Estab. %   Basos 1 Not Estab. %   Neutrophils Absolute 3.1 1.4 - 7.0 x10E3/uL   Lymphocytes Absolute 2.0 0.7 - 3.1 x10E3/uL   Monocytes Absolute 0.4 0.1 - 0.9 x10E3/uL   EOS (ABSOLUTE) 0.1 0.0 - 0.4 x10E3/uL   Basophils Absolute 0.0 0.0 - 0.2 x10E3/uL   Immature Granulocytes 0 Not Estab. %   Immature Grans (Abs) 0.0 0.0 - 0.1 x10E3/uL  Iron, TIBC and Ferritin Panel     Status: None   Collection Time: 10/12/23 10:09 AM  Result Value Ref Range   Total Iron Binding Capacity 311 250 - 450 ug/dL   UIBC 780 868 - 574 ug/dL   Iron 92 27 - 840 ug/dL   Iron Saturation 30 15 - 55 %   Ferritin 35 15 - 150 ng/mL  POCT Urinalysis Dipstick (18997)     Status: Abnormal   Collection Time: 11/12/23  2:06 PM  Result Value Ref Range   Color, UA yellow    Clarity, UA clear    Glucose, UA Negative Negative   Bilirubin, UA 1    Ketones, UA 5    Spec Grav, UA >=1.030 (A) 1.010 - 1.025   Blood, UA neg    pH, UA 6.0 5.0 - 8.0   Protein, UA Positive (A) Negative   Urobilinogen, UA 0.2 0.2 or 1.0 E.U./dL   Nitrite, UA pos    Leukocytes, UA Moderate (2+) (A) Negative   Appearance     Odor        Assessment & Plan:  Urine culture Cipro   Problem List Items Addressed This Visit       Other   Other fatigue - Primary   Relevant Orders   POCT Urinalysis Dipstick (18997)  (Completed)   Other Visit Diagnoses       Abnormal urine       Relevant Orders   Urine Culture       Return if symptoms worsen or fail to improve.   Total time spent: 25 minutes  Google, NP  11/12/2023   This document may have been prepared by [employer] Voice Recognition software and as such may include unintentional dictation errors.

## 2023-11-25 ENCOUNTER — Ambulatory Visit
Admission: RE | Admit: 2023-11-25 | Discharge: 2023-11-25 | Disposition: A | Discharge status: Home / Self Care | Source: Ambulatory Visit | Attending: Cardiology | Admitting: Cardiology

## 2023-11-25 DIAGNOSIS — Z1231 Encounter for screening mammogram for malignant neoplasm of breast: Secondary | ICD-10-CM | POA: Diagnosis present

## 2023-11-27 ENCOUNTER — Ambulatory Visit: Payer: Self-pay | Admitting: Cardiology

## 2024-01-26 ENCOUNTER — Other Ambulatory Visit: Payer: Self-pay | Admitting: Cardiology

## 2024-02-04 ENCOUNTER — Other Ambulatory Visit: Payer: Self-pay | Admitting: Cardiology

## 2024-02-11 ENCOUNTER — Ambulatory Visit: Admitting: Cardiology

## 2024-02-23 ENCOUNTER — Other Ambulatory Visit: Payer: Self-pay | Admitting: Cardiology

## 2024-05-05 ENCOUNTER — Other Ambulatory Visit: Payer: Self-pay | Admitting: Cardiology
